# Patient Record
Sex: Female | Born: 1955 | Race: White | Hispanic: No | Smoking: Current every day smoker
Health system: Southern US, Community
[De-identification: ages and names within clinical notes are randomized; demographics above are authoritative.]

## PROBLEM LIST (undated history)

## (undated) DIAGNOSIS — I10 Essential (primary) hypertension: Secondary | ICD-10-CM

## (undated) DIAGNOSIS — E785 Hyperlipidemia, unspecified: Secondary | ICD-10-CM

## (undated) HISTORY — DX: Essential (primary) hypertension: I10

## (undated) HISTORY — PX: HYSTEROTOMY: SHX1776

## (undated) HISTORY — DX: Hyperlipidemia, unspecified: E78.5

## (undated) HISTORY — PX: CHOLECYSTECTOMY: SHX55

---

## 2019-04-18 DIAGNOSIS — M159 Polyosteoarthritis, unspecified: Secondary | ICD-10-CM | POA: Diagnosis not present

## 2019-04-18 DIAGNOSIS — Z1331 Encounter for screening for depression: Secondary | ICD-10-CM | POA: Diagnosis not present

## 2019-04-18 DIAGNOSIS — K219 Gastro-esophageal reflux disease without esophagitis: Secondary | ICD-10-CM | POA: Diagnosis not present

## 2019-04-18 DIAGNOSIS — R519 Headache, unspecified: Secondary | ICD-10-CM | POA: Diagnosis not present

## 2019-04-18 DIAGNOSIS — R252 Cramp and spasm: Secondary | ICD-10-CM | POA: Diagnosis not present

## 2019-04-18 DIAGNOSIS — R7989 Other specified abnormal findings of blood chemistry: Secondary | ICD-10-CM | POA: Diagnosis not present

## 2019-04-18 DIAGNOSIS — J208 Acute bronchitis due to other specified organisms: Secondary | ICD-10-CM | POA: Diagnosis not present

## 2019-04-18 DIAGNOSIS — E781 Pure hyperglyceridemia: Secondary | ICD-10-CM | POA: Diagnosis not present

## 2019-04-18 DIAGNOSIS — F419 Anxiety disorder, unspecified: Secondary | ICD-10-CM | POA: Diagnosis not present

## 2019-05-02 DIAGNOSIS — E663 Overweight: Secondary | ICD-10-CM | POA: Diagnosis not present

## 2019-05-02 DIAGNOSIS — Z720 Tobacco use: Secondary | ICD-10-CM | POA: Diagnosis not present

## 2019-05-02 DIAGNOSIS — E781 Pure hyperglyceridemia: Secondary | ICD-10-CM | POA: Diagnosis not present

## 2019-05-02 DIAGNOSIS — R7989 Other specified abnormal findings of blood chemistry: Secondary | ICD-10-CM | POA: Diagnosis not present

## 2019-05-02 DIAGNOSIS — M79604 Pain in right leg: Secondary | ICD-10-CM | POA: Diagnosis not present

## 2019-05-02 DIAGNOSIS — F419 Anxiety disorder, unspecified: Secondary | ICD-10-CM | POA: Diagnosis not present

## 2019-05-02 DIAGNOSIS — R252 Cramp and spasm: Secondary | ICD-10-CM | POA: Diagnosis not present

## 2019-05-02 DIAGNOSIS — R519 Headache, unspecified: Secondary | ICD-10-CM | POA: Diagnosis not present

## 2019-05-02 DIAGNOSIS — K219 Gastro-esophageal reflux disease without esophagitis: Secondary | ICD-10-CM | POA: Diagnosis not present

## 2019-05-19 DIAGNOSIS — M159 Polyosteoarthritis, unspecified: Secondary | ICD-10-CM | POA: Diagnosis not present

## 2019-05-19 DIAGNOSIS — E781 Pure hyperglyceridemia: Secondary | ICD-10-CM | POA: Diagnosis not present

## 2019-05-19 DIAGNOSIS — K219 Gastro-esophageal reflux disease without esophagitis: Secondary | ICD-10-CM | POA: Diagnosis not present

## 2019-05-19 DIAGNOSIS — F419 Anxiety disorder, unspecified: Secondary | ICD-10-CM | POA: Diagnosis not present

## 2019-05-19 DIAGNOSIS — M659 Synovitis and tenosynovitis, unspecified: Secondary | ICD-10-CM | POA: Diagnosis not present

## 2019-05-19 DIAGNOSIS — R252 Cramp and spasm: Secondary | ICD-10-CM | POA: Diagnosis not present

## 2019-05-19 DIAGNOSIS — R7989 Other specified abnormal findings of blood chemistry: Secondary | ICD-10-CM | POA: Diagnosis not present

## 2019-05-19 DIAGNOSIS — R519 Headache, unspecified: Secondary | ICD-10-CM | POA: Diagnosis not present

## 2019-05-19 DIAGNOSIS — M79604 Pain in right leg: Secondary | ICD-10-CM | POA: Diagnosis not present

## 2019-06-02 DIAGNOSIS — R252 Cramp and spasm: Secondary | ICD-10-CM | POA: Diagnosis not present

## 2019-06-02 DIAGNOSIS — M79604 Pain in right leg: Secondary | ICD-10-CM | POA: Diagnosis not present

## 2019-06-02 DIAGNOSIS — R519 Headache, unspecified: Secondary | ICD-10-CM | POA: Diagnosis not present

## 2019-06-02 DIAGNOSIS — F419 Anxiety disorder, unspecified: Secondary | ICD-10-CM | POA: Diagnosis not present

## 2019-06-02 DIAGNOSIS — K219 Gastro-esophageal reflux disease without esophagitis: Secondary | ICD-10-CM | POA: Diagnosis not present

## 2019-06-02 DIAGNOSIS — M159 Polyosteoarthritis, unspecified: Secondary | ICD-10-CM | POA: Diagnosis not present

## 2019-06-02 DIAGNOSIS — M79605 Pain in left leg: Secondary | ICD-10-CM | POA: Diagnosis not present

## 2019-06-02 DIAGNOSIS — E781 Pure hyperglyceridemia: Secondary | ICD-10-CM | POA: Diagnosis not present

## 2019-06-02 DIAGNOSIS — R7989 Other specified abnormal findings of blood chemistry: Secondary | ICD-10-CM | POA: Diagnosis not present

## 2019-06-13 DIAGNOSIS — H52223 Regular astigmatism, bilateral: Secondary | ICD-10-CM | POA: Diagnosis not present

## 2019-06-15 DIAGNOSIS — M159 Polyosteoarthritis, unspecified: Secondary | ICD-10-CM | POA: Diagnosis not present

## 2019-06-15 DIAGNOSIS — R519 Headache, unspecified: Secondary | ICD-10-CM | POA: Diagnosis not present

## 2019-06-15 DIAGNOSIS — R7989 Other specified abnormal findings of blood chemistry: Secondary | ICD-10-CM | POA: Diagnosis not present

## 2019-06-15 DIAGNOSIS — E663 Overweight: Secondary | ICD-10-CM | POA: Diagnosis not present

## 2019-06-15 DIAGNOSIS — R252 Cramp and spasm: Secondary | ICD-10-CM | POA: Diagnosis not present

## 2019-06-15 DIAGNOSIS — M79604 Pain in right leg: Secondary | ICD-10-CM | POA: Diagnosis not present

## 2019-06-15 DIAGNOSIS — E781 Pure hyperglyceridemia: Secondary | ICD-10-CM | POA: Diagnosis not present

## 2019-06-15 DIAGNOSIS — K219 Gastro-esophageal reflux disease without esophagitis: Secondary | ICD-10-CM | POA: Diagnosis not present

## 2019-06-15 DIAGNOSIS — F419 Anxiety disorder, unspecified: Secondary | ICD-10-CM | POA: Diagnosis not present

## 2019-06-16 DIAGNOSIS — H52223 Regular astigmatism, bilateral: Secondary | ICD-10-CM | POA: Diagnosis not present

## 2019-06-16 DIAGNOSIS — H524 Presbyopia: Secondary | ICD-10-CM | POA: Diagnosis not present

## 2019-06-23 DIAGNOSIS — K219 Gastro-esophageal reflux disease without esophagitis: Secondary | ICD-10-CM | POA: Diagnosis not present

## 2019-06-23 DIAGNOSIS — J208 Acute bronchitis due to other specified organisms: Secondary | ICD-10-CM | POA: Diagnosis not present

## 2019-06-23 DIAGNOSIS — E781 Pure hyperglyceridemia: Secondary | ICD-10-CM | POA: Diagnosis not present

## 2019-06-23 DIAGNOSIS — R252 Cramp and spasm: Secondary | ICD-10-CM | POA: Diagnosis not present

## 2019-06-23 DIAGNOSIS — F419 Anxiety disorder, unspecified: Secondary | ICD-10-CM | POA: Diagnosis not present

## 2019-06-23 DIAGNOSIS — R7989 Other specified abnormal findings of blood chemistry: Secondary | ICD-10-CM | POA: Diagnosis not present

## 2019-06-23 DIAGNOSIS — M159 Polyosteoarthritis, unspecified: Secondary | ICD-10-CM | POA: Diagnosis not present

## 2019-06-23 DIAGNOSIS — R519 Headache, unspecified: Secondary | ICD-10-CM | POA: Diagnosis not present

## 2019-06-23 DIAGNOSIS — B9689 Other specified bacterial agents as the cause of diseases classified elsewhere: Secondary | ICD-10-CM | POA: Diagnosis not present

## 2019-07-07 DIAGNOSIS — Z720 Tobacco use: Secondary | ICD-10-CM | POA: Diagnosis not present

## 2019-07-07 DIAGNOSIS — K219 Gastro-esophageal reflux disease without esophagitis: Secondary | ICD-10-CM | POA: Diagnosis not present

## 2019-07-07 DIAGNOSIS — F419 Anxiety disorder, unspecified: Secondary | ICD-10-CM | POA: Diagnosis not present

## 2019-07-07 DIAGNOSIS — E781 Pure hyperglyceridemia: Secondary | ICD-10-CM | POA: Diagnosis not present

## 2019-07-07 DIAGNOSIS — R519 Headache, unspecified: Secondary | ICD-10-CM | POA: Diagnosis not present

## 2019-07-07 DIAGNOSIS — M159 Polyosteoarthritis, unspecified: Secondary | ICD-10-CM | POA: Diagnosis not present

## 2019-07-07 DIAGNOSIS — R7989 Other specified abnormal findings of blood chemistry: Secondary | ICD-10-CM | POA: Diagnosis not present

## 2019-07-07 DIAGNOSIS — R252 Cramp and spasm: Secondary | ICD-10-CM | POA: Diagnosis not present

## 2019-07-07 DIAGNOSIS — M79604 Pain in right leg: Secondary | ICD-10-CM | POA: Diagnosis not present

## 2019-07-21 DIAGNOSIS — R7989 Other specified abnormal findings of blood chemistry: Secondary | ICD-10-CM | POA: Diagnosis not present

## 2019-07-21 DIAGNOSIS — M79604 Pain in right leg: Secondary | ICD-10-CM | POA: Diagnosis not present

## 2019-07-21 DIAGNOSIS — F419 Anxiety disorder, unspecified: Secondary | ICD-10-CM | POA: Diagnosis not present

## 2019-07-21 DIAGNOSIS — M79605 Pain in left leg: Secondary | ICD-10-CM | POA: Diagnosis not present

## 2019-07-21 DIAGNOSIS — M159 Polyosteoarthritis, unspecified: Secondary | ICD-10-CM | POA: Diagnosis not present

## 2019-07-21 DIAGNOSIS — K219 Gastro-esophageal reflux disease without esophagitis: Secondary | ICD-10-CM | POA: Diagnosis not present

## 2019-07-21 DIAGNOSIS — R252 Cramp and spasm: Secondary | ICD-10-CM | POA: Diagnosis not present

## 2019-07-21 DIAGNOSIS — R519 Headache, unspecified: Secondary | ICD-10-CM | POA: Diagnosis not present

## 2019-07-21 DIAGNOSIS — E781 Pure hyperglyceridemia: Secondary | ICD-10-CM | POA: Diagnosis not present

## 2019-08-04 DIAGNOSIS — E663 Overweight: Secondary | ICD-10-CM | POA: Diagnosis not present

## 2019-08-04 DIAGNOSIS — Z6824 Body mass index (BMI) 24.0-24.9, adult: Secondary | ICD-10-CM | POA: Diagnosis not present

## 2019-08-04 DIAGNOSIS — E781 Pure hyperglyceridemia: Secondary | ICD-10-CM | POA: Diagnosis not present

## 2019-08-04 DIAGNOSIS — F419 Anxiety disorder, unspecified: Secondary | ICD-10-CM | POA: Diagnosis not present

## 2019-08-04 DIAGNOSIS — R519 Headache, unspecified: Secondary | ICD-10-CM | POA: Diagnosis not present

## 2019-08-09 DIAGNOSIS — G8929 Other chronic pain: Secondary | ICD-10-CM | POA: Insufficient documentation

## 2019-08-09 DIAGNOSIS — M545 Low back pain: Secondary | ICD-10-CM | POA: Diagnosis not present

## 2019-08-09 DIAGNOSIS — M5136 Other intervertebral disc degeneration, lumbar region: Secondary | ICD-10-CM | POA: Insufficient documentation

## 2019-08-09 DIAGNOSIS — E7800 Pure hypercholesterolemia, unspecified: Secondary | ICD-10-CM

## 2019-08-09 HISTORY — DX: Other intervertebral disc degeneration, lumbar region: M51.36

## 2019-08-09 HISTORY — DX: Pure hypercholesterolemia, unspecified: E78.00

## 2019-08-12 ENCOUNTER — Other Ambulatory Visit: Payer: Self-pay | Admitting: Orthopedic Surgery

## 2019-08-12 DIAGNOSIS — M545 Low back pain, unspecified: Secondary | ICD-10-CM

## 2019-08-18 DIAGNOSIS — R519 Headache, unspecified: Secondary | ICD-10-CM | POA: Diagnosis not present

## 2019-08-18 DIAGNOSIS — R252 Cramp and spasm: Secondary | ICD-10-CM | POA: Diagnosis not present

## 2019-08-18 DIAGNOSIS — R7989 Other specified abnormal findings of blood chemistry: Secondary | ICD-10-CM | POA: Diagnosis not present

## 2019-08-18 DIAGNOSIS — F419 Anxiety disorder, unspecified: Secondary | ICD-10-CM | POA: Diagnosis not present

## 2019-08-18 DIAGNOSIS — K219 Gastro-esophageal reflux disease without esophagitis: Secondary | ICD-10-CM | POA: Diagnosis not present

## 2019-08-18 DIAGNOSIS — M159 Polyosteoarthritis, unspecified: Secondary | ICD-10-CM | POA: Diagnosis not present

## 2019-08-18 DIAGNOSIS — M25551 Pain in right hip: Secondary | ICD-10-CM | POA: Diagnosis not present

## 2019-08-18 DIAGNOSIS — Z720 Tobacco use: Secondary | ICD-10-CM | POA: Diagnosis not present

## 2019-08-18 DIAGNOSIS — E781 Pure hyperglyceridemia: Secondary | ICD-10-CM | POA: Diagnosis not present

## 2019-09-06 DIAGNOSIS — Z720 Tobacco use: Secondary | ICD-10-CM | POA: Diagnosis not present

## 2019-09-06 DIAGNOSIS — B9689 Other specified bacterial agents as the cause of diseases classified elsewhere: Secondary | ICD-10-CM | POA: Diagnosis not present

## 2019-09-06 DIAGNOSIS — R519 Headache, unspecified: Secondary | ICD-10-CM | POA: Diagnosis not present

## 2019-09-06 DIAGNOSIS — K219 Gastro-esophageal reflux disease without esophagitis: Secondary | ICD-10-CM | POA: Diagnosis not present

## 2019-09-06 DIAGNOSIS — R7989 Other specified abnormal findings of blood chemistry: Secondary | ICD-10-CM | POA: Diagnosis not present

## 2019-09-06 DIAGNOSIS — J028 Acute pharyngitis due to other specified organisms: Secondary | ICD-10-CM | POA: Diagnosis not present

## 2019-09-06 DIAGNOSIS — F419 Anxiety disorder, unspecified: Secondary | ICD-10-CM | POA: Diagnosis not present

## 2019-09-06 DIAGNOSIS — E781 Pure hyperglyceridemia: Secondary | ICD-10-CM | POA: Diagnosis not present

## 2019-09-06 DIAGNOSIS — R252 Cramp and spasm: Secondary | ICD-10-CM | POA: Diagnosis not present

## 2019-09-12 ENCOUNTER — Ambulatory Visit
Admission: RE | Admit: 2019-09-12 | Discharge: 2019-09-12 | Disposition: A | Discharge status: Home / Self Care | Payer: Medicare HMO | Source: Ambulatory Visit | Attending: Orthopedic Surgery | Admitting: Orthopedic Surgery

## 2019-09-12 ENCOUNTER — Other Ambulatory Visit: Payer: Self-pay

## 2019-09-12 DIAGNOSIS — M48061 Spinal stenosis, lumbar region without neurogenic claudication: Secondary | ICD-10-CM | POA: Diagnosis not present

## 2019-09-12 DIAGNOSIS — M545 Low back pain, unspecified: Secondary | ICD-10-CM

## 2019-09-15 DIAGNOSIS — R252 Cramp and spasm: Secondary | ICD-10-CM | POA: Diagnosis not present

## 2019-09-15 DIAGNOSIS — F419 Anxiety disorder, unspecified: Secondary | ICD-10-CM | POA: Diagnosis not present

## 2019-09-15 DIAGNOSIS — R0600 Dyspnea, unspecified: Secondary | ICD-10-CM | POA: Diagnosis not present

## 2019-09-15 DIAGNOSIS — K219 Gastro-esophageal reflux disease without esophagitis: Secondary | ICD-10-CM | POA: Diagnosis not present

## 2019-09-15 DIAGNOSIS — E781 Pure hyperglyceridemia: Secondary | ICD-10-CM | POA: Diagnosis not present

## 2019-09-15 DIAGNOSIS — M79604 Pain in right leg: Secondary | ICD-10-CM | POA: Diagnosis not present

## 2019-09-15 DIAGNOSIS — R7989 Other specified abnormal findings of blood chemistry: Secondary | ICD-10-CM | POA: Diagnosis not present

## 2019-09-15 DIAGNOSIS — M79605 Pain in left leg: Secondary | ICD-10-CM | POA: Diagnosis not present

## 2019-09-15 DIAGNOSIS — M159 Polyosteoarthritis, unspecified: Secondary | ICD-10-CM | POA: Diagnosis not present

## 2019-09-15 DIAGNOSIS — R519 Headache, unspecified: Secondary | ICD-10-CM | POA: Diagnosis not present

## 2019-09-27 DIAGNOSIS — M5136 Other intervertebral disc degeneration, lumbar region: Secondary | ICD-10-CM | POA: Diagnosis not present

## 2019-10-06 DIAGNOSIS — M545 Low back pain: Secondary | ICD-10-CM | POA: Diagnosis not present

## 2019-10-06 DIAGNOSIS — M5136 Other intervertebral disc degeneration, lumbar region: Secondary | ICD-10-CM | POA: Diagnosis not present

## 2019-10-13 DIAGNOSIS — R7989 Other specified abnormal findings of blood chemistry: Secondary | ICD-10-CM | POA: Diagnosis not present

## 2019-10-13 DIAGNOSIS — M79604 Pain in right leg: Secondary | ICD-10-CM | POA: Diagnosis not present

## 2019-10-13 DIAGNOSIS — F419 Anxiety disorder, unspecified: Secondary | ICD-10-CM | POA: Diagnosis not present

## 2019-10-13 DIAGNOSIS — R252 Cramp and spasm: Secondary | ICD-10-CM | POA: Diagnosis not present

## 2019-10-13 DIAGNOSIS — M79605 Pain in left leg: Secondary | ICD-10-CM | POA: Diagnosis not present

## 2019-10-13 DIAGNOSIS — K219 Gastro-esophageal reflux disease without esophagitis: Secondary | ICD-10-CM | POA: Diagnosis not present

## 2019-10-13 DIAGNOSIS — R519 Headache, unspecified: Secondary | ICD-10-CM | POA: Diagnosis not present

## 2019-10-13 DIAGNOSIS — E781 Pure hyperglyceridemia: Secondary | ICD-10-CM | POA: Diagnosis not present

## 2019-10-13 DIAGNOSIS — M5136 Other intervertebral disc degeneration, lumbar region: Secondary | ICD-10-CM | POA: Diagnosis not present

## 2019-10-13 DIAGNOSIS — M159 Polyosteoarthritis, unspecified: Secondary | ICD-10-CM | POA: Diagnosis not present

## 2019-10-18 DIAGNOSIS — M5136 Other intervertebral disc degeneration, lumbar region: Secondary | ICD-10-CM | POA: Diagnosis not present

## 2019-10-18 DIAGNOSIS — M545 Low back pain: Secondary | ICD-10-CM | POA: Diagnosis not present

## 2019-10-24 DIAGNOSIS — M5136 Other intervertebral disc degeneration, lumbar region: Secondary | ICD-10-CM | POA: Diagnosis not present

## 2019-10-24 DIAGNOSIS — M48062 Spinal stenosis, lumbar region with neurogenic claudication: Secondary | ICD-10-CM | POA: Diagnosis not present

## 2019-10-24 DIAGNOSIS — G8929 Other chronic pain: Secondary | ICD-10-CM | POA: Diagnosis not present

## 2019-11-02 DIAGNOSIS — J208 Acute bronchitis due to other specified organisms: Secondary | ICD-10-CM | POA: Diagnosis not present

## 2019-11-02 DIAGNOSIS — R7989 Other specified abnormal findings of blood chemistry: Secondary | ICD-10-CM | POA: Diagnosis not present

## 2019-11-02 DIAGNOSIS — E781 Pure hyperglyceridemia: Secondary | ICD-10-CM | POA: Diagnosis not present

## 2019-11-02 DIAGNOSIS — R252 Cramp and spasm: Secondary | ICD-10-CM | POA: Diagnosis not present

## 2019-11-02 DIAGNOSIS — Z720 Tobacco use: Secondary | ICD-10-CM | POA: Diagnosis not present

## 2019-11-02 DIAGNOSIS — R519 Headache, unspecified: Secondary | ICD-10-CM | POA: Diagnosis not present

## 2019-11-02 DIAGNOSIS — F419 Anxiety disorder, unspecified: Secondary | ICD-10-CM | POA: Diagnosis not present

## 2019-11-02 DIAGNOSIS — K219 Gastro-esophageal reflux disease without esophagitis: Secondary | ICD-10-CM | POA: Diagnosis not present

## 2019-11-02 DIAGNOSIS — B9689 Other specified bacterial agents as the cause of diseases classified elsewhere: Secondary | ICD-10-CM | POA: Diagnosis not present

## 2019-11-10 DIAGNOSIS — F419 Anxiety disorder, unspecified: Secondary | ICD-10-CM | POA: Diagnosis not present

## 2019-11-10 DIAGNOSIS — R252 Cramp and spasm: Secondary | ICD-10-CM | POA: Diagnosis not present

## 2019-11-10 DIAGNOSIS — R519 Headache, unspecified: Secondary | ICD-10-CM | POA: Diagnosis not present

## 2019-11-10 DIAGNOSIS — K219 Gastro-esophageal reflux disease without esophagitis: Secondary | ICD-10-CM | POA: Diagnosis not present

## 2019-11-10 DIAGNOSIS — R7989 Other specified abnormal findings of blood chemistry: Secondary | ICD-10-CM | POA: Diagnosis not present

## 2019-11-10 DIAGNOSIS — E781 Pure hyperglyceridemia: Secondary | ICD-10-CM | POA: Diagnosis not present

## 2019-11-10 DIAGNOSIS — J208 Acute bronchitis due to other specified organisms: Secondary | ICD-10-CM | POA: Diagnosis not present

## 2019-11-10 DIAGNOSIS — B9689 Other specified bacterial agents as the cause of diseases classified elsewhere: Secondary | ICD-10-CM | POA: Diagnosis not present

## 2019-11-10 DIAGNOSIS — Z720 Tobacco use: Secondary | ICD-10-CM | POA: Diagnosis not present

## 2019-11-16 DIAGNOSIS — E663 Overweight: Secondary | ICD-10-CM | POA: Diagnosis not present

## 2019-11-16 DIAGNOSIS — J309 Allergic rhinitis, unspecified: Secondary | ICD-10-CM | POA: Diagnosis not present

## 2019-11-16 DIAGNOSIS — Z6825 Body mass index (BMI) 25.0-25.9, adult: Secondary | ICD-10-CM | POA: Diagnosis not present

## 2019-11-16 DIAGNOSIS — R252 Cramp and spasm: Secondary | ICD-10-CM | POA: Diagnosis not present

## 2019-11-16 DIAGNOSIS — Z720 Tobacco use: Secondary | ICD-10-CM | POA: Diagnosis not present

## 2019-11-16 DIAGNOSIS — F419 Anxiety disorder, unspecified: Secondary | ICD-10-CM | POA: Diagnosis not present

## 2019-11-16 DIAGNOSIS — R7989 Other specified abnormal findings of blood chemistry: Secondary | ICD-10-CM | POA: Diagnosis not present

## 2019-11-16 DIAGNOSIS — K219 Gastro-esophageal reflux disease without esophagitis: Secondary | ICD-10-CM | POA: Diagnosis not present

## 2019-11-24 DIAGNOSIS — F419 Anxiety disorder, unspecified: Secondary | ICD-10-CM | POA: Diagnosis not present

## 2019-11-24 DIAGNOSIS — M79605 Pain in left leg: Secondary | ICD-10-CM | POA: Diagnosis not present

## 2019-11-24 DIAGNOSIS — M79604 Pain in right leg: Secondary | ICD-10-CM | POA: Diagnosis not present

## 2019-11-24 DIAGNOSIS — R7989 Other specified abnormal findings of blood chemistry: Secondary | ICD-10-CM | POA: Diagnosis not present

## 2019-11-24 DIAGNOSIS — E781 Pure hyperglyceridemia: Secondary | ICD-10-CM | POA: Diagnosis not present

## 2019-11-24 DIAGNOSIS — Z720 Tobacco use: Secondary | ICD-10-CM | POA: Diagnosis not present

## 2019-11-24 DIAGNOSIS — R252 Cramp and spasm: Secondary | ICD-10-CM | POA: Diagnosis not present

## 2019-11-24 DIAGNOSIS — K219 Gastro-esophageal reflux disease without esophagitis: Secondary | ICD-10-CM | POA: Diagnosis not present

## 2019-11-24 DIAGNOSIS — M159 Polyosteoarthritis, unspecified: Secondary | ICD-10-CM | POA: Diagnosis not present

## 2019-12-08 DIAGNOSIS — K219 Gastro-esophageal reflux disease without esophagitis: Secondary | ICD-10-CM | POA: Diagnosis not present

## 2019-12-08 DIAGNOSIS — M159 Polyosteoarthritis, unspecified: Secondary | ICD-10-CM | POA: Diagnosis not present

## 2019-12-08 DIAGNOSIS — E781 Pure hyperglyceridemia: Secondary | ICD-10-CM | POA: Diagnosis not present

## 2019-12-08 DIAGNOSIS — F419 Anxiety disorder, unspecified: Secondary | ICD-10-CM | POA: Diagnosis not present

## 2019-12-08 DIAGNOSIS — M79604 Pain in right leg: Secondary | ICD-10-CM | POA: Diagnosis not present

## 2019-12-08 DIAGNOSIS — R519 Headache, unspecified: Secondary | ICD-10-CM | POA: Diagnosis not present

## 2019-12-08 DIAGNOSIS — R252 Cramp and spasm: Secondary | ICD-10-CM | POA: Diagnosis not present

## 2019-12-08 DIAGNOSIS — R7989 Other specified abnormal findings of blood chemistry: Secondary | ICD-10-CM | POA: Diagnosis not present

## 2019-12-08 DIAGNOSIS — M79605 Pain in left leg: Secondary | ICD-10-CM | POA: Diagnosis not present

## 2020-01-05 DIAGNOSIS — K219 Gastro-esophageal reflux disease without esophagitis: Secondary | ICD-10-CM | POA: Diagnosis not present

## 2020-01-05 DIAGNOSIS — M159 Polyosteoarthritis, unspecified: Secondary | ICD-10-CM | POA: Diagnosis not present

## 2020-01-05 DIAGNOSIS — E781 Pure hyperglyceridemia: Secondary | ICD-10-CM | POA: Diagnosis not present

## 2020-01-05 DIAGNOSIS — Z720 Tobacco use: Secondary | ICD-10-CM | POA: Diagnosis not present

## 2020-01-05 DIAGNOSIS — R252 Cramp and spasm: Secondary | ICD-10-CM | POA: Diagnosis not present

## 2020-01-05 DIAGNOSIS — R519 Headache, unspecified: Secondary | ICD-10-CM | POA: Diagnosis not present

## 2020-01-05 DIAGNOSIS — F419 Anxiety disorder, unspecified: Secondary | ICD-10-CM | POA: Diagnosis not present

## 2020-01-05 DIAGNOSIS — R7989 Other specified abnormal findings of blood chemistry: Secondary | ICD-10-CM | POA: Diagnosis not present

## 2020-01-05 DIAGNOSIS — Z23 Encounter for immunization: Secondary | ICD-10-CM | POA: Diagnosis not present

## 2020-01-24 DIAGNOSIS — R7989 Other specified abnormal findings of blood chemistry: Secondary | ICD-10-CM | POA: Diagnosis not present

## 2020-01-24 DIAGNOSIS — R519 Headache, unspecified: Secondary | ICD-10-CM | POA: Diagnosis not present

## 2020-01-24 DIAGNOSIS — L0390 Cellulitis, unspecified: Secondary | ICD-10-CM | POA: Diagnosis not present

## 2020-01-24 DIAGNOSIS — R252 Cramp and spasm: Secondary | ICD-10-CM | POA: Diagnosis not present

## 2020-01-24 DIAGNOSIS — F419 Anxiety disorder, unspecified: Secondary | ICD-10-CM | POA: Diagnosis not present

## 2020-01-24 DIAGNOSIS — K219 Gastro-esophageal reflux disease without esophagitis: Secondary | ICD-10-CM | POA: Diagnosis not present

## 2020-01-24 DIAGNOSIS — Z720 Tobacco use: Secondary | ICD-10-CM | POA: Diagnosis not present

## 2020-01-24 DIAGNOSIS — E781 Pure hyperglyceridemia: Secondary | ICD-10-CM | POA: Diagnosis not present

## 2020-01-24 DIAGNOSIS — M159 Polyosteoarthritis, unspecified: Secondary | ICD-10-CM | POA: Diagnosis not present

## 2020-02-02 DIAGNOSIS — R7989 Other specified abnormal findings of blood chemistry: Secondary | ICD-10-CM | POA: Diagnosis not present

## 2020-02-02 DIAGNOSIS — R252 Cramp and spasm: Secondary | ICD-10-CM | POA: Diagnosis not present

## 2020-02-02 DIAGNOSIS — Z720 Tobacco use: Secondary | ICD-10-CM | POA: Diagnosis not present

## 2020-02-02 DIAGNOSIS — F419 Anxiety disorder, unspecified: Secondary | ICD-10-CM | POA: Diagnosis not present

## 2020-02-02 DIAGNOSIS — L0390 Cellulitis, unspecified: Secondary | ICD-10-CM | POA: Diagnosis not present

## 2020-02-02 DIAGNOSIS — M159 Polyosteoarthritis, unspecified: Secondary | ICD-10-CM | POA: Diagnosis not present

## 2020-02-02 DIAGNOSIS — R519 Headache, unspecified: Secondary | ICD-10-CM | POA: Diagnosis not present

## 2020-02-02 DIAGNOSIS — E781 Pure hyperglyceridemia: Secondary | ICD-10-CM | POA: Diagnosis not present

## 2020-02-02 DIAGNOSIS — K219 Gastro-esophageal reflux disease without esophagitis: Secondary | ICD-10-CM | POA: Diagnosis not present

## 2020-03-01 DIAGNOSIS — F419 Anxiety disorder, unspecified: Secondary | ICD-10-CM | POA: Diagnosis not present

## 2020-03-01 DIAGNOSIS — R7989 Other specified abnormal findings of blood chemistry: Secondary | ICD-10-CM | POA: Diagnosis not present

## 2020-03-01 DIAGNOSIS — R030 Elevated blood-pressure reading, without diagnosis of hypertension: Secondary | ICD-10-CM | POA: Diagnosis not present

## 2020-03-01 DIAGNOSIS — J0100 Acute maxillary sinusitis, unspecified: Secondary | ICD-10-CM | POA: Diagnosis not present

## 2020-03-01 DIAGNOSIS — K219 Gastro-esophageal reflux disease without esophagitis: Secondary | ICD-10-CM | POA: Diagnosis not present

## 2020-03-01 DIAGNOSIS — Z720 Tobacco use: Secondary | ICD-10-CM | POA: Diagnosis not present

## 2020-03-01 DIAGNOSIS — R252 Cramp and spasm: Secondary | ICD-10-CM | POA: Diagnosis not present

## 2020-03-01 DIAGNOSIS — E781 Pure hyperglyceridemia: Secondary | ICD-10-CM | POA: Diagnosis not present

## 2020-03-01 DIAGNOSIS — M159 Polyosteoarthritis, unspecified: Secondary | ICD-10-CM | POA: Diagnosis not present

## 2020-03-05 DIAGNOSIS — R252 Cramp and spasm: Secondary | ICD-10-CM | POA: Diagnosis not present

## 2020-03-05 DIAGNOSIS — R7989 Other specified abnormal findings of blood chemistry: Secondary | ICD-10-CM | POA: Diagnosis not present

## 2020-03-05 DIAGNOSIS — I10 Essential (primary) hypertension: Secondary | ICD-10-CM | POA: Diagnosis not present

## 2020-03-05 DIAGNOSIS — E781 Pure hyperglyceridemia: Secondary | ICD-10-CM | POA: Diagnosis not present

## 2020-03-05 DIAGNOSIS — M79604 Pain in right leg: Secondary | ICD-10-CM | POA: Diagnosis not present

## 2020-03-05 DIAGNOSIS — R519 Headache, unspecified: Secondary | ICD-10-CM | POA: Diagnosis not present

## 2020-03-05 DIAGNOSIS — M159 Polyosteoarthritis, unspecified: Secondary | ICD-10-CM | POA: Diagnosis not present

## 2020-03-05 DIAGNOSIS — K219 Gastro-esophageal reflux disease without esophagitis: Secondary | ICD-10-CM | POA: Diagnosis not present

## 2020-03-05 DIAGNOSIS — F419 Anxiety disorder, unspecified: Secondary | ICD-10-CM | POA: Diagnosis not present

## 2020-03-13 DIAGNOSIS — R519 Headache, unspecified: Secondary | ICD-10-CM | POA: Diagnosis not present

## 2020-03-13 DIAGNOSIS — K219 Gastro-esophageal reflux disease without esophagitis: Secondary | ICD-10-CM | POA: Diagnosis not present

## 2020-03-13 DIAGNOSIS — R7989 Other specified abnormal findings of blood chemistry: Secondary | ICD-10-CM | POA: Diagnosis not present

## 2020-03-13 DIAGNOSIS — I10 Essential (primary) hypertension: Secondary | ICD-10-CM | POA: Diagnosis not present

## 2020-03-13 DIAGNOSIS — R252 Cramp and spasm: Secondary | ICD-10-CM | POA: Diagnosis not present

## 2020-03-13 DIAGNOSIS — M79604 Pain in right leg: Secondary | ICD-10-CM | POA: Diagnosis not present

## 2020-03-13 DIAGNOSIS — M159 Polyosteoarthritis, unspecified: Secondary | ICD-10-CM | POA: Diagnosis not present

## 2020-03-13 DIAGNOSIS — E781 Pure hyperglyceridemia: Secondary | ICD-10-CM | POA: Diagnosis not present

## 2020-03-13 DIAGNOSIS — F419 Anxiety disorder, unspecified: Secondary | ICD-10-CM | POA: Diagnosis not present

## 2020-04-05 DIAGNOSIS — B9689 Other specified bacterial agents as the cause of diseases classified elsewhere: Secondary | ICD-10-CM | POA: Diagnosis not present

## 2020-04-05 DIAGNOSIS — F419 Anxiety disorder, unspecified: Secondary | ICD-10-CM | POA: Diagnosis not present

## 2020-04-05 DIAGNOSIS — J208 Acute bronchitis due to other specified organisms: Secondary | ICD-10-CM | POA: Diagnosis not present

## 2020-04-05 DIAGNOSIS — R252 Cramp and spasm: Secondary | ICD-10-CM | POA: Diagnosis not present

## 2020-04-05 DIAGNOSIS — K219 Gastro-esophageal reflux disease without esophagitis: Secondary | ICD-10-CM | POA: Diagnosis not present

## 2020-04-05 DIAGNOSIS — M159 Polyosteoarthritis, unspecified: Secondary | ICD-10-CM | POA: Diagnosis not present

## 2020-04-05 DIAGNOSIS — R7989 Other specified abnormal findings of blood chemistry: Secondary | ICD-10-CM | POA: Diagnosis not present

## 2020-04-05 DIAGNOSIS — I10 Essential (primary) hypertension: Secondary | ICD-10-CM | POA: Diagnosis not present

## 2020-04-05 DIAGNOSIS — E781 Pure hyperglyceridemia: Secondary | ICD-10-CM | POA: Diagnosis not present

## 2020-04-19 DIAGNOSIS — J069 Acute upper respiratory infection, unspecified: Secondary | ICD-10-CM | POA: Diagnosis not present

## 2020-04-19 DIAGNOSIS — E781 Pure hyperglyceridemia: Secondary | ICD-10-CM | POA: Diagnosis not present

## 2020-04-19 DIAGNOSIS — R252 Cramp and spasm: Secondary | ICD-10-CM | POA: Diagnosis not present

## 2020-04-19 DIAGNOSIS — K219 Gastro-esophageal reflux disease without esophagitis: Secondary | ICD-10-CM | POA: Diagnosis not present

## 2020-04-19 DIAGNOSIS — R519 Headache, unspecified: Secondary | ICD-10-CM | POA: Diagnosis not present

## 2020-04-19 DIAGNOSIS — I10 Essential (primary) hypertension: Secondary | ICD-10-CM | POA: Diagnosis not present

## 2020-04-19 DIAGNOSIS — F419 Anxiety disorder, unspecified: Secondary | ICD-10-CM | POA: Diagnosis not present

## 2020-04-19 DIAGNOSIS — M79604 Pain in right leg: Secondary | ICD-10-CM | POA: Diagnosis not present

## 2020-04-19 DIAGNOSIS — R7989 Other specified abnormal findings of blood chemistry: Secondary | ICD-10-CM | POA: Diagnosis not present

## 2020-04-27 DIAGNOSIS — Z1231 Encounter for screening mammogram for malignant neoplasm of breast: Secondary | ICD-10-CM | POA: Diagnosis not present

## 2020-05-03 DIAGNOSIS — I10 Essential (primary) hypertension: Secondary | ICD-10-CM | POA: Diagnosis not present

## 2020-05-03 DIAGNOSIS — R252 Cramp and spasm: Secondary | ICD-10-CM | POA: Diagnosis not present

## 2020-05-03 DIAGNOSIS — M159 Polyosteoarthritis, unspecified: Secondary | ICD-10-CM | POA: Diagnosis not present

## 2020-05-03 DIAGNOSIS — R7989 Other specified abnormal findings of blood chemistry: Secondary | ICD-10-CM | POA: Diagnosis not present

## 2020-05-03 DIAGNOSIS — M79604 Pain in right leg: Secondary | ICD-10-CM | POA: Diagnosis not present

## 2020-05-03 DIAGNOSIS — F419 Anxiety disorder, unspecified: Secondary | ICD-10-CM | POA: Diagnosis not present

## 2020-05-03 DIAGNOSIS — R519 Headache, unspecified: Secondary | ICD-10-CM | POA: Diagnosis not present

## 2020-05-03 DIAGNOSIS — E781 Pure hyperglyceridemia: Secondary | ICD-10-CM | POA: Diagnosis not present

## 2020-05-03 DIAGNOSIS — K219 Gastro-esophageal reflux disease without esophagitis: Secondary | ICD-10-CM | POA: Diagnosis not present

## 2020-05-31 DIAGNOSIS — I10 Essential (primary) hypertension: Secondary | ICD-10-CM | POA: Diagnosis not present

## 2020-05-31 DIAGNOSIS — F419 Anxiety disorder, unspecified: Secondary | ICD-10-CM | POA: Diagnosis not present

## 2020-05-31 DIAGNOSIS — Z1331 Encounter for screening for depression: Secondary | ICD-10-CM | POA: Diagnosis not present

## 2020-05-31 DIAGNOSIS — K219 Gastro-esophageal reflux disease without esophagitis: Secondary | ICD-10-CM | POA: Diagnosis not present

## 2020-05-31 DIAGNOSIS — M159 Polyosteoarthritis, unspecified: Secondary | ICD-10-CM | POA: Diagnosis not present

## 2020-05-31 DIAGNOSIS — Z139 Encounter for screening, unspecified: Secondary | ICD-10-CM | POA: Diagnosis not present

## 2020-05-31 DIAGNOSIS — E781 Pure hyperglyceridemia: Secondary | ICD-10-CM | POA: Diagnosis not present

## 2020-05-31 DIAGNOSIS — R7989 Other specified abnormal findings of blood chemistry: Secondary | ICD-10-CM | POA: Diagnosis not present

## 2020-05-31 DIAGNOSIS — R252 Cramp and spasm: Secondary | ICD-10-CM | POA: Diagnosis not present

## 2020-06-28 DIAGNOSIS — R7989 Other specified abnormal findings of blood chemistry: Secondary | ICD-10-CM | POA: Diagnosis not present

## 2020-06-28 DIAGNOSIS — I10 Essential (primary) hypertension: Secondary | ICD-10-CM | POA: Diagnosis not present

## 2020-06-28 DIAGNOSIS — R252 Cramp and spasm: Secondary | ICD-10-CM | POA: Diagnosis not present

## 2020-06-28 DIAGNOSIS — R519 Headache, unspecified: Secondary | ICD-10-CM | POA: Diagnosis not present

## 2020-06-28 DIAGNOSIS — K219 Gastro-esophageal reflux disease without esophagitis: Secondary | ICD-10-CM | POA: Diagnosis not present

## 2020-06-28 DIAGNOSIS — M79604 Pain in right leg: Secondary | ICD-10-CM | POA: Diagnosis not present

## 2020-06-28 DIAGNOSIS — J309 Allergic rhinitis, unspecified: Secondary | ICD-10-CM | POA: Diagnosis not present

## 2020-06-28 DIAGNOSIS — M159 Polyosteoarthritis, unspecified: Secondary | ICD-10-CM | POA: Diagnosis not present

## 2020-06-28 DIAGNOSIS — F419 Anxiety disorder, unspecified: Secondary | ICD-10-CM | POA: Diagnosis not present

## 2020-07-05 DIAGNOSIS — H5203 Hypermetropia, bilateral: Secondary | ICD-10-CM | POA: Diagnosis not present

## 2020-07-26 DIAGNOSIS — R7989 Other specified abnormal findings of blood chemistry: Secondary | ICD-10-CM | POA: Diagnosis not present

## 2020-07-26 DIAGNOSIS — I10 Essential (primary) hypertension: Secondary | ICD-10-CM | POA: Diagnosis not present

## 2020-07-26 DIAGNOSIS — F419 Anxiety disorder, unspecified: Secondary | ICD-10-CM | POA: Diagnosis not present

## 2020-07-26 DIAGNOSIS — R252 Cramp and spasm: Secondary | ICD-10-CM | POA: Diagnosis not present

## 2020-07-26 DIAGNOSIS — J309 Allergic rhinitis, unspecified: Secondary | ICD-10-CM | POA: Diagnosis not present

## 2020-07-26 DIAGNOSIS — Z9181 History of falling: Secondary | ICD-10-CM | POA: Diagnosis not present

## 2020-07-26 DIAGNOSIS — R519 Headache, unspecified: Secondary | ICD-10-CM | POA: Diagnosis not present

## 2020-07-26 DIAGNOSIS — K219 Gastro-esophageal reflux disease without esophagitis: Secondary | ICD-10-CM | POA: Diagnosis not present

## 2020-07-26 DIAGNOSIS — M159 Polyosteoarthritis, unspecified: Secondary | ICD-10-CM | POA: Diagnosis not present

## 2020-08-24 DIAGNOSIS — K219 Gastro-esophageal reflux disease without esophagitis: Secondary | ICD-10-CM | POA: Diagnosis not present

## 2020-08-24 DIAGNOSIS — I10 Essential (primary) hypertension: Secondary | ICD-10-CM | POA: Diagnosis not present

## 2020-08-24 DIAGNOSIS — J069 Acute upper respiratory infection, unspecified: Secondary | ICD-10-CM | POA: Diagnosis not present

## 2020-08-24 DIAGNOSIS — R7989 Other specified abnormal findings of blood chemistry: Secondary | ICD-10-CM | POA: Diagnosis not present

## 2020-08-24 DIAGNOSIS — F419 Anxiety disorder, unspecified: Secondary | ICD-10-CM | POA: Diagnosis not present

## 2020-08-24 DIAGNOSIS — R252 Cramp and spasm: Secondary | ICD-10-CM | POA: Diagnosis not present

## 2020-08-24 DIAGNOSIS — R519 Headache, unspecified: Secondary | ICD-10-CM | POA: Diagnosis not present

## 2020-08-24 DIAGNOSIS — M159 Polyosteoarthritis, unspecified: Secondary | ICD-10-CM | POA: Diagnosis not present

## 2020-08-24 DIAGNOSIS — J309 Allergic rhinitis, unspecified: Secondary | ICD-10-CM | POA: Diagnosis not present

## 2020-09-26 DIAGNOSIS — F419 Anxiety disorder, unspecified: Secondary | ICD-10-CM | POA: Diagnosis not present

## 2020-09-26 DIAGNOSIS — R252 Cramp and spasm: Secondary | ICD-10-CM | POA: Diagnosis not present

## 2020-09-26 DIAGNOSIS — M79604 Pain in right leg: Secondary | ICD-10-CM | POA: Diagnosis not present

## 2020-09-26 DIAGNOSIS — K219 Gastro-esophageal reflux disease without esophagitis: Secondary | ICD-10-CM | POA: Diagnosis not present

## 2020-09-26 DIAGNOSIS — J309 Allergic rhinitis, unspecified: Secondary | ICD-10-CM | POA: Diagnosis not present

## 2020-09-26 DIAGNOSIS — I10 Essential (primary) hypertension: Secondary | ICD-10-CM | POA: Diagnosis not present

## 2020-09-26 DIAGNOSIS — R519 Headache, unspecified: Secondary | ICD-10-CM | POA: Diagnosis not present

## 2020-09-26 DIAGNOSIS — R7989 Other specified abnormal findings of blood chemistry: Secondary | ICD-10-CM | POA: Diagnosis not present

## 2020-09-26 DIAGNOSIS — M159 Polyosteoarthritis, unspecified: Secondary | ICD-10-CM | POA: Diagnosis not present

## 2020-10-24 DIAGNOSIS — R519 Headache, unspecified: Secondary | ICD-10-CM | POA: Diagnosis not present

## 2020-10-24 DIAGNOSIS — R252 Cramp and spasm: Secondary | ICD-10-CM | POA: Diagnosis not present

## 2020-10-24 DIAGNOSIS — R7989 Other specified abnormal findings of blood chemistry: Secondary | ICD-10-CM | POA: Diagnosis not present

## 2020-10-24 DIAGNOSIS — Z6825 Body mass index (BMI) 25.0-25.9, adult: Secondary | ICD-10-CM | POA: Diagnosis not present

## 2020-10-24 DIAGNOSIS — J309 Allergic rhinitis, unspecified: Secondary | ICD-10-CM | POA: Diagnosis not present

## 2020-10-24 DIAGNOSIS — F419 Anxiety disorder, unspecified: Secondary | ICD-10-CM | POA: Diagnosis not present

## 2020-10-24 DIAGNOSIS — K219 Gastro-esophageal reflux disease without esophagitis: Secondary | ICD-10-CM | POA: Diagnosis not present

## 2020-10-24 DIAGNOSIS — M159 Polyosteoarthritis, unspecified: Secondary | ICD-10-CM | POA: Diagnosis not present

## 2020-10-24 DIAGNOSIS — I10 Essential (primary) hypertension: Secondary | ICD-10-CM | POA: Diagnosis not present

## 2020-10-24 DIAGNOSIS — E781 Pure hyperglyceridemia: Secondary | ICD-10-CM | POA: Diagnosis not present

## 2020-10-30 DIAGNOSIS — Z0000 Encounter for general adult medical examination without abnormal findings: Secondary | ICD-10-CM | POA: Diagnosis not present

## 2020-10-30 DIAGNOSIS — Z1331 Encounter for screening for depression: Secondary | ICD-10-CM | POA: Diagnosis not present

## 2020-10-30 DIAGNOSIS — E785 Hyperlipidemia, unspecified: Secondary | ICD-10-CM | POA: Diagnosis not present

## 2020-10-30 DIAGNOSIS — Z9181 History of falling: Secondary | ICD-10-CM | POA: Diagnosis not present

## 2020-11-21 DIAGNOSIS — I10 Essential (primary) hypertension: Secondary | ICD-10-CM | POA: Diagnosis not present

## 2020-11-21 DIAGNOSIS — R519 Headache, unspecified: Secondary | ICD-10-CM | POA: Diagnosis not present

## 2020-11-21 DIAGNOSIS — B9689 Other specified bacterial agents as the cause of diseases classified elsewhere: Secondary | ICD-10-CM | POA: Diagnosis not present

## 2020-11-21 DIAGNOSIS — K219 Gastro-esophageal reflux disease without esophagitis: Secondary | ICD-10-CM | POA: Diagnosis not present

## 2020-11-21 DIAGNOSIS — F419 Anxiety disorder, unspecified: Secondary | ICD-10-CM | POA: Diagnosis not present

## 2020-11-21 DIAGNOSIS — M159 Polyosteoarthritis, unspecified: Secondary | ICD-10-CM | POA: Diagnosis not present

## 2020-11-21 DIAGNOSIS — R7989 Other specified abnormal findings of blood chemistry: Secondary | ICD-10-CM | POA: Diagnosis not present

## 2020-11-21 DIAGNOSIS — J208 Acute bronchitis due to other specified organisms: Secondary | ICD-10-CM | POA: Diagnosis not present

## 2020-11-21 DIAGNOSIS — R252 Cramp and spasm: Secondary | ICD-10-CM | POA: Diagnosis not present

## 2020-12-17 DIAGNOSIS — I10 Essential (primary) hypertension: Secondary | ICD-10-CM | POA: Diagnosis not present

## 2020-12-17 DIAGNOSIS — J208 Acute bronchitis due to other specified organisms: Secondary | ICD-10-CM | POA: Diagnosis not present

## 2020-12-17 DIAGNOSIS — Z6825 Body mass index (BMI) 25.0-25.9, adult: Secondary | ICD-10-CM | POA: Diagnosis not present

## 2020-12-17 DIAGNOSIS — F419 Anxiety disorder, unspecified: Secondary | ICD-10-CM | POA: Diagnosis not present

## 2020-12-19 DIAGNOSIS — R7989 Other specified abnormal findings of blood chemistry: Secondary | ICD-10-CM | POA: Diagnosis not present

## 2020-12-19 DIAGNOSIS — I10 Essential (primary) hypertension: Secondary | ICD-10-CM | POA: Diagnosis not present

## 2020-12-19 DIAGNOSIS — F419 Anxiety disorder, unspecified: Secondary | ICD-10-CM | POA: Diagnosis not present

## 2020-12-19 DIAGNOSIS — J309 Allergic rhinitis, unspecified: Secondary | ICD-10-CM | POA: Diagnosis not present

## 2020-12-19 DIAGNOSIS — J069 Acute upper respiratory infection, unspecified: Secondary | ICD-10-CM | POA: Diagnosis not present

## 2020-12-19 DIAGNOSIS — M79604 Pain in right leg: Secondary | ICD-10-CM | POA: Diagnosis not present

## 2020-12-19 DIAGNOSIS — R519 Headache, unspecified: Secondary | ICD-10-CM | POA: Diagnosis not present

## 2020-12-19 DIAGNOSIS — K219 Gastro-esophageal reflux disease without esophagitis: Secondary | ICD-10-CM | POA: Diagnosis not present

## 2020-12-19 DIAGNOSIS — R252 Cramp and spasm: Secondary | ICD-10-CM | POA: Diagnosis not present

## 2020-12-26 DIAGNOSIS — R7989 Other specified abnormal findings of blood chemistry: Secondary | ICD-10-CM | POA: Diagnosis not present

## 2020-12-26 DIAGNOSIS — M79604 Pain in right leg: Secondary | ICD-10-CM | POA: Diagnosis not present

## 2020-12-26 DIAGNOSIS — R519 Headache, unspecified: Secondary | ICD-10-CM | POA: Diagnosis not present

## 2020-12-26 DIAGNOSIS — F419 Anxiety disorder, unspecified: Secondary | ICD-10-CM | POA: Diagnosis not present

## 2020-12-26 DIAGNOSIS — I10 Essential (primary) hypertension: Secondary | ICD-10-CM | POA: Diagnosis not present

## 2020-12-26 DIAGNOSIS — M659 Synovitis and tenosynovitis, unspecified: Secondary | ICD-10-CM | POA: Diagnosis not present

## 2020-12-26 DIAGNOSIS — J309 Allergic rhinitis, unspecified: Secondary | ICD-10-CM | POA: Diagnosis not present

## 2020-12-26 DIAGNOSIS — R252 Cramp and spasm: Secondary | ICD-10-CM | POA: Diagnosis not present

## 2020-12-26 DIAGNOSIS — K219 Gastro-esophageal reflux disease without esophagitis: Secondary | ICD-10-CM | POA: Diagnosis not present

## 2021-01-02 DIAGNOSIS — M659 Synovitis and tenosynovitis, unspecified: Secondary | ICD-10-CM | POA: Diagnosis not present

## 2021-01-02 DIAGNOSIS — M79604 Pain in right leg: Secondary | ICD-10-CM | POA: Diagnosis not present

## 2021-01-02 DIAGNOSIS — R519 Headache, unspecified: Secondary | ICD-10-CM | POA: Diagnosis not present

## 2021-01-02 DIAGNOSIS — F419 Anxiety disorder, unspecified: Secondary | ICD-10-CM | POA: Diagnosis not present

## 2021-01-02 DIAGNOSIS — K219 Gastro-esophageal reflux disease without esophagitis: Secondary | ICD-10-CM | POA: Diagnosis not present

## 2021-01-02 DIAGNOSIS — R252 Cramp and spasm: Secondary | ICD-10-CM | POA: Diagnosis not present

## 2021-01-02 DIAGNOSIS — I10 Essential (primary) hypertension: Secondary | ICD-10-CM | POA: Diagnosis not present

## 2021-01-02 DIAGNOSIS — R7989 Other specified abnormal findings of blood chemistry: Secondary | ICD-10-CM | POA: Diagnosis not present

## 2021-01-02 DIAGNOSIS — J309 Allergic rhinitis, unspecified: Secondary | ICD-10-CM | POA: Diagnosis not present

## 2021-01-16 DIAGNOSIS — B9689 Other specified bacterial agents as the cause of diseases classified elsewhere: Secondary | ICD-10-CM | POA: Diagnosis not present

## 2021-01-16 DIAGNOSIS — F419 Anxiety disorder, unspecified: Secondary | ICD-10-CM | POA: Diagnosis not present

## 2021-01-16 DIAGNOSIS — R7989 Other specified abnormal findings of blood chemistry: Secondary | ICD-10-CM | POA: Diagnosis not present

## 2021-01-16 DIAGNOSIS — R519 Headache, unspecified: Secondary | ICD-10-CM | POA: Diagnosis not present

## 2021-01-16 DIAGNOSIS — M159 Polyosteoarthritis, unspecified: Secondary | ICD-10-CM | POA: Diagnosis not present

## 2021-01-16 DIAGNOSIS — E781 Pure hyperglyceridemia: Secondary | ICD-10-CM | POA: Diagnosis not present

## 2021-01-16 DIAGNOSIS — J208 Acute bronchitis due to other specified organisms: Secondary | ICD-10-CM | POA: Diagnosis not present

## 2021-01-16 DIAGNOSIS — R252 Cramp and spasm: Secondary | ICD-10-CM | POA: Diagnosis not present

## 2021-01-16 DIAGNOSIS — I10 Essential (primary) hypertension: Secondary | ICD-10-CM | POA: Diagnosis not present

## 2021-02-13 DIAGNOSIS — E663 Overweight: Secondary | ICD-10-CM | POA: Diagnosis not present

## 2021-02-13 DIAGNOSIS — J111 Influenza due to unidentified influenza virus with other respiratory manifestations: Secondary | ICD-10-CM | POA: Diagnosis not present

## 2021-02-13 DIAGNOSIS — F419 Anxiety disorder, unspecified: Secondary | ICD-10-CM | POA: Diagnosis not present

## 2021-02-13 DIAGNOSIS — Z6825 Body mass index (BMI) 25.0-25.9, adult: Secondary | ICD-10-CM | POA: Diagnosis not present

## 2021-02-13 DIAGNOSIS — I10 Essential (primary) hypertension: Secondary | ICD-10-CM | POA: Diagnosis not present

## 2021-02-19 DIAGNOSIS — I10 Essential (primary) hypertension: Secondary | ICD-10-CM | POA: Diagnosis not present

## 2021-02-19 DIAGNOSIS — Z6825 Body mass index (BMI) 25.0-25.9, adult: Secondary | ICD-10-CM | POA: Diagnosis not present

## 2021-02-19 DIAGNOSIS — E663 Overweight: Secondary | ICD-10-CM | POA: Diagnosis not present

## 2021-02-19 DIAGNOSIS — F419 Anxiety disorder, unspecified: Secondary | ICD-10-CM | POA: Diagnosis not present

## 2021-02-19 DIAGNOSIS — J069 Acute upper respiratory infection, unspecified: Secondary | ICD-10-CM | POA: Diagnosis not present

## 2021-03-12 DIAGNOSIS — I10 Essential (primary) hypertension: Secondary | ICD-10-CM | POA: Diagnosis not present

## 2021-03-12 DIAGNOSIS — R7989 Other specified abnormal findings of blood chemistry: Secondary | ICD-10-CM | POA: Diagnosis not present

## 2021-03-12 DIAGNOSIS — K219 Gastro-esophageal reflux disease without esophagitis: Secondary | ICD-10-CM | POA: Diagnosis not present

## 2021-03-12 DIAGNOSIS — M159 Polyosteoarthritis, unspecified: Secondary | ICD-10-CM | POA: Diagnosis not present

## 2021-03-12 DIAGNOSIS — R519 Headache, unspecified: Secondary | ICD-10-CM | POA: Diagnosis not present

## 2021-03-12 DIAGNOSIS — J309 Allergic rhinitis, unspecified: Secondary | ICD-10-CM | POA: Diagnosis not present

## 2021-03-12 DIAGNOSIS — M79604 Pain in right leg: Secondary | ICD-10-CM | POA: Diagnosis not present

## 2021-03-12 DIAGNOSIS — F419 Anxiety disorder, unspecified: Secondary | ICD-10-CM | POA: Diagnosis not present

## 2021-03-12 DIAGNOSIS — R252 Cramp and spasm: Secondary | ICD-10-CM | POA: Diagnosis not present

## 2021-04-09 DIAGNOSIS — K219 Gastro-esophageal reflux disease without esophagitis: Secondary | ICD-10-CM | POA: Diagnosis not present

## 2021-04-09 DIAGNOSIS — J309 Allergic rhinitis, unspecified: Secondary | ICD-10-CM | POA: Diagnosis not present

## 2021-04-09 DIAGNOSIS — E781 Pure hyperglyceridemia: Secondary | ICD-10-CM | POA: Diagnosis not present

## 2021-04-09 DIAGNOSIS — R252 Cramp and spasm: Secondary | ICD-10-CM | POA: Diagnosis not present

## 2021-04-09 DIAGNOSIS — I10 Essential (primary) hypertension: Secondary | ICD-10-CM | POA: Diagnosis not present

## 2021-04-09 DIAGNOSIS — R519 Headache, unspecified: Secondary | ICD-10-CM | POA: Diagnosis not present

## 2021-04-09 DIAGNOSIS — F419 Anxiety disorder, unspecified: Secondary | ICD-10-CM | POA: Diagnosis not present

## 2021-04-09 DIAGNOSIS — M159 Polyosteoarthritis, unspecified: Secondary | ICD-10-CM | POA: Diagnosis not present

## 2021-04-09 DIAGNOSIS — R7989 Other specified abnormal findings of blood chemistry: Secondary | ICD-10-CM | POA: Diagnosis not present

## 2021-04-09 DIAGNOSIS — M79604 Pain in right leg: Secondary | ICD-10-CM | POA: Diagnosis not present

## 2021-04-15 DIAGNOSIS — R7989 Other specified abnormal findings of blood chemistry: Secondary | ICD-10-CM | POA: Diagnosis not present

## 2021-04-15 DIAGNOSIS — R519 Headache, unspecified: Secondary | ICD-10-CM | POA: Diagnosis not present

## 2021-04-15 DIAGNOSIS — F419 Anxiety disorder, unspecified: Secondary | ICD-10-CM | POA: Diagnosis not present

## 2021-04-15 DIAGNOSIS — I10 Essential (primary) hypertension: Secondary | ICD-10-CM | POA: Diagnosis not present

## 2021-04-15 DIAGNOSIS — B9689 Other specified bacterial agents as the cause of diseases classified elsewhere: Secondary | ICD-10-CM | POA: Diagnosis not present

## 2021-04-15 DIAGNOSIS — K219 Gastro-esophageal reflux disease without esophagitis: Secondary | ICD-10-CM | POA: Diagnosis not present

## 2021-04-15 DIAGNOSIS — J208 Acute bronchitis due to other specified organisms: Secondary | ICD-10-CM | POA: Diagnosis not present

## 2021-04-15 DIAGNOSIS — M159 Polyosteoarthritis, unspecified: Secondary | ICD-10-CM | POA: Diagnosis not present

## 2021-04-15 DIAGNOSIS — R252 Cramp and spasm: Secondary | ICD-10-CM | POA: Diagnosis not present

## 2021-05-10 DIAGNOSIS — R7989 Other specified abnormal findings of blood chemistry: Secondary | ICD-10-CM | POA: Diagnosis not present

## 2021-05-10 DIAGNOSIS — M79605 Pain in left leg: Secondary | ICD-10-CM | POA: Diagnosis not present

## 2021-05-10 DIAGNOSIS — I10 Essential (primary) hypertension: Secondary | ICD-10-CM | POA: Diagnosis not present

## 2021-05-10 DIAGNOSIS — M79604 Pain in right leg: Secondary | ICD-10-CM | POA: Diagnosis not present

## 2021-05-10 DIAGNOSIS — R519 Headache, unspecified: Secondary | ICD-10-CM | POA: Diagnosis not present

## 2021-05-10 DIAGNOSIS — F419 Anxiety disorder, unspecified: Secondary | ICD-10-CM | POA: Diagnosis not present

## 2021-05-10 DIAGNOSIS — J309 Allergic rhinitis, unspecified: Secondary | ICD-10-CM | POA: Diagnosis not present

## 2021-05-10 DIAGNOSIS — M159 Polyosteoarthritis, unspecified: Secondary | ICD-10-CM | POA: Diagnosis not present

## 2021-05-10 DIAGNOSIS — K219 Gastro-esophageal reflux disease without esophagitis: Secondary | ICD-10-CM | POA: Diagnosis not present

## 2021-05-24 DIAGNOSIS — K635 Polyp of colon: Secondary | ICD-10-CM | POA: Diagnosis not present

## 2021-05-24 DIAGNOSIS — D120 Benign neoplasm of cecum: Secondary | ICD-10-CM | POA: Diagnosis not present

## 2021-05-24 DIAGNOSIS — Z86010 Personal history of colonic polyps: Secondary | ICD-10-CM | POA: Diagnosis not present

## 2021-05-24 DIAGNOSIS — Z1211 Encounter for screening for malignant neoplasm of colon: Secondary | ICD-10-CM | POA: Diagnosis not present

## 2021-05-27 DIAGNOSIS — R7989 Other specified abnormal findings of blood chemistry: Secondary | ICD-10-CM | POA: Diagnosis not present

## 2021-05-27 DIAGNOSIS — K219 Gastro-esophageal reflux disease without esophagitis: Secondary | ICD-10-CM | POA: Diagnosis not present

## 2021-05-27 DIAGNOSIS — M79605 Pain in left leg: Secondary | ICD-10-CM | POA: Diagnosis not present

## 2021-05-27 DIAGNOSIS — I10 Essential (primary) hypertension: Secondary | ICD-10-CM | POA: Diagnosis not present

## 2021-05-27 DIAGNOSIS — J309 Allergic rhinitis, unspecified: Secondary | ICD-10-CM | POA: Diagnosis not present

## 2021-05-27 DIAGNOSIS — M159 Polyosteoarthritis, unspecified: Secondary | ICD-10-CM | POA: Diagnosis not present

## 2021-05-27 DIAGNOSIS — F419 Anxiety disorder, unspecified: Secondary | ICD-10-CM | POA: Diagnosis not present

## 2021-05-27 DIAGNOSIS — M79604 Pain in right leg: Secondary | ICD-10-CM | POA: Diagnosis not present

## 2021-05-27 DIAGNOSIS — R519 Headache, unspecified: Secondary | ICD-10-CM | POA: Diagnosis not present

## 2021-06-10 DIAGNOSIS — I10 Essential (primary) hypertension: Secondary | ICD-10-CM | POA: Diagnosis not present

## 2021-06-10 DIAGNOSIS — R519 Headache, unspecified: Secondary | ICD-10-CM | POA: Diagnosis not present

## 2021-06-10 DIAGNOSIS — Z139 Encounter for screening, unspecified: Secondary | ICD-10-CM | POA: Diagnosis not present

## 2021-06-10 DIAGNOSIS — R7989 Other specified abnormal findings of blood chemistry: Secondary | ICD-10-CM | POA: Diagnosis not present

## 2021-06-10 DIAGNOSIS — M159 Polyosteoarthritis, unspecified: Secondary | ICD-10-CM | POA: Diagnosis not present

## 2021-06-10 DIAGNOSIS — M79604 Pain in right leg: Secondary | ICD-10-CM | POA: Diagnosis not present

## 2021-06-10 DIAGNOSIS — J309 Allergic rhinitis, unspecified: Secondary | ICD-10-CM | POA: Diagnosis not present

## 2021-06-10 DIAGNOSIS — F419 Anxiety disorder, unspecified: Secondary | ICD-10-CM | POA: Diagnosis not present

## 2021-06-10 DIAGNOSIS — K219 Gastro-esophageal reflux disease without esophagitis: Secondary | ICD-10-CM | POA: Diagnosis not present

## 2021-06-20 IMAGING — MR MR LUMBAR SPINE W/O CM
5 series · 40 of 48 positions shown · non-contrast
Comparison: No pertinent prior studies available for comparison.

CLINICAL DATA: Low back pain, unspecified back pain laterality,
unspecified chronicity, unspecified whether sciatica present.
Additional history provided by technologist: Patient reports
shooting pains in leg/back, weakness since 2343.

EXAM:
MRI LUMBAR SPINE WITHOUT CONTRAST
TECHNIQUE: Multiplanar, multisequence MR imaging of the lumbar spine was
performed. No intravenous contrast was administered.

[Series 3: T2 post-contrast · sagittal · 4.0mm · 0.88mm/px · 5 of 12 slices shown]
[im 1/12]
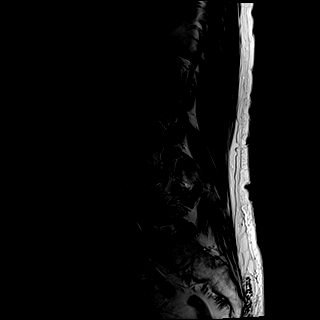
[im 3/12]
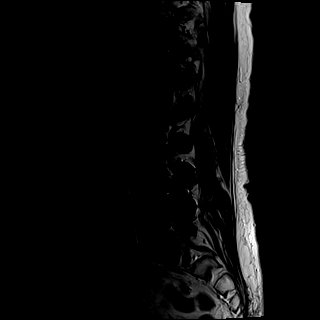
[im 6/12]
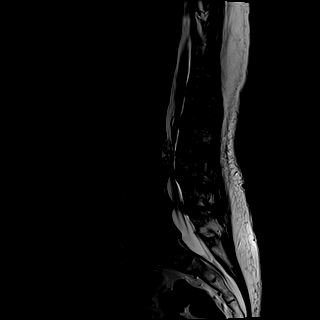
[im 9/12]
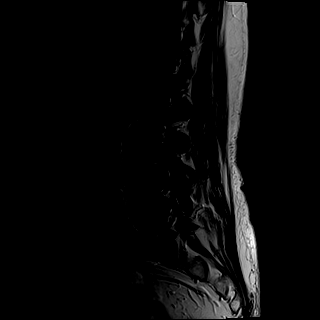
[im 12/12]
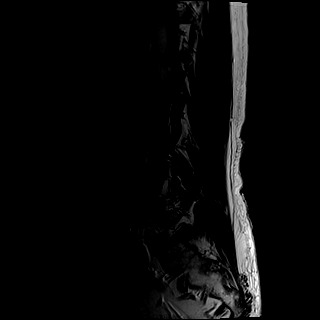

[Series 4: T1 · sagittal · 4.0mm · 0.88mm/px · 5 of 12 slices shown (1 of 2)]
[im 1/12]
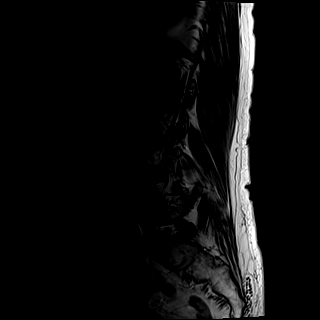
[im 3/12]
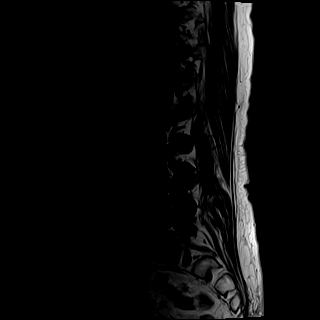
[im 6/12]
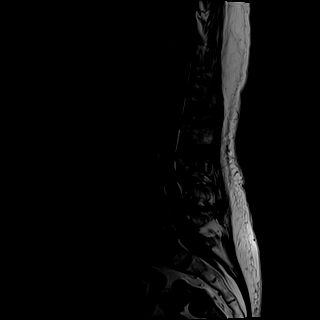
[im 9/12]
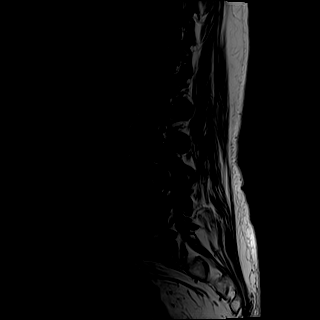
[im 12/12]
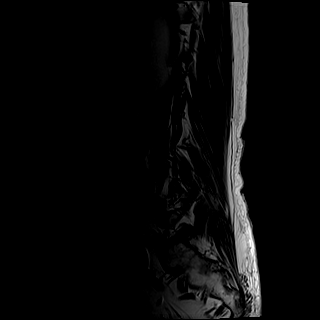

[Series 5: tirm sag · sagittal · 4.0mm · 0.55mm/px · 6 of 12 slices shown]
[im 1/12]
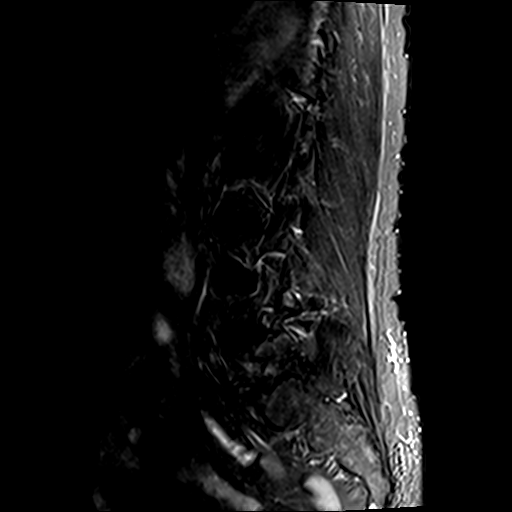
[im 3/12]
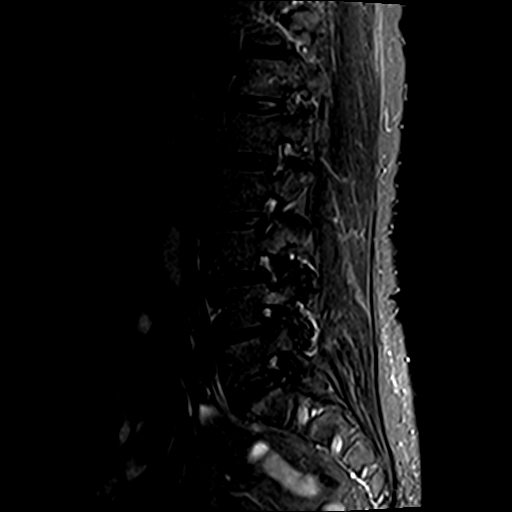
[im 5/12]
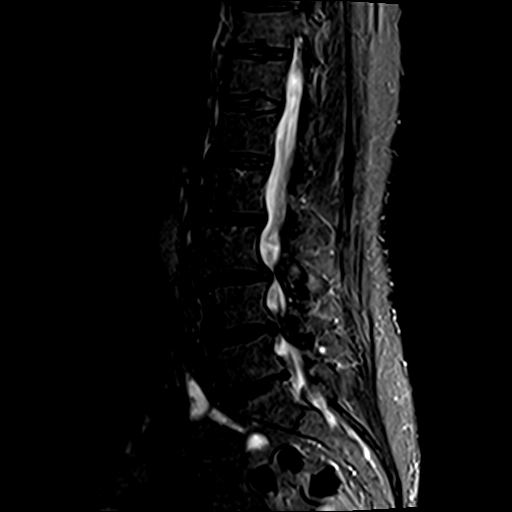
[im 7/12]
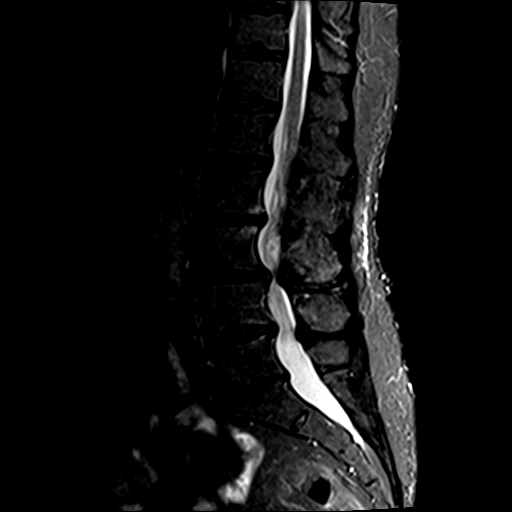
[im 9/12]
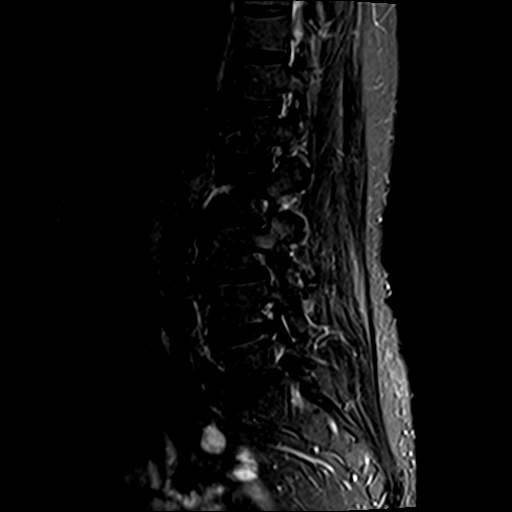
[im 12/12]
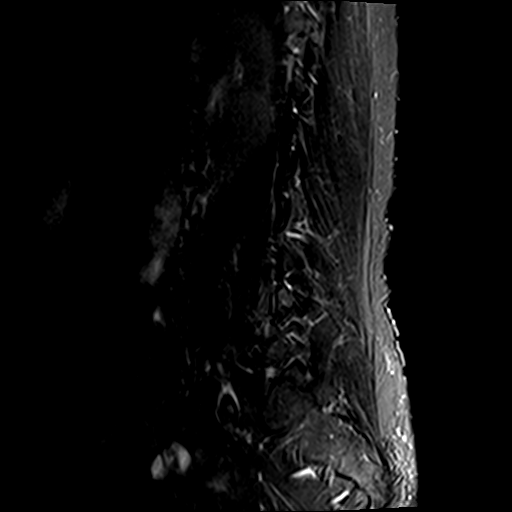

[Series 6: T1 · axial · 4.0mm · 0.78mm/px · z∈[-44,+145]mm · 10 of 34 slices shown (2 of 2)]
[im 3/34]
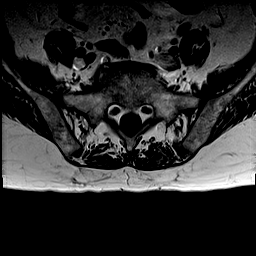
[im 5/34]
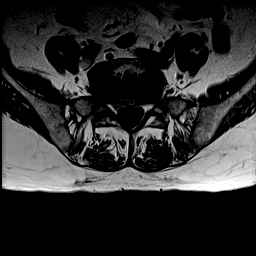
[im 7/34]
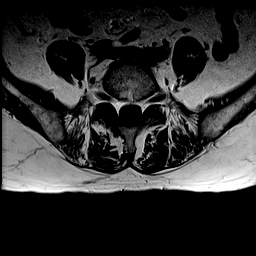
[im 12/34]
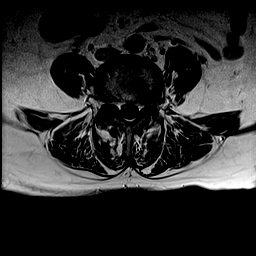
[im 16/34]
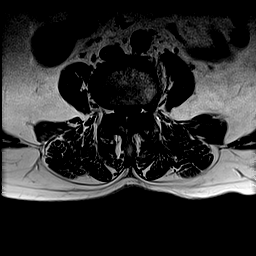
[im 18/34]
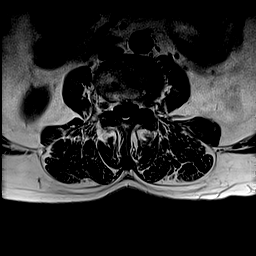
[im 20/34]
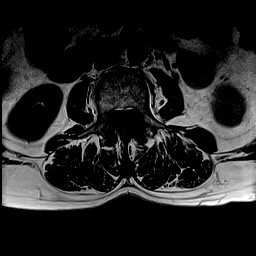
[im 25/34]
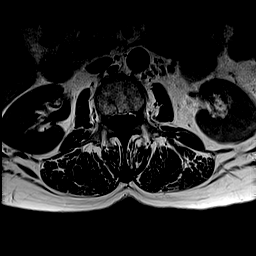
[im 29/34]
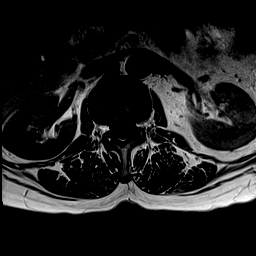
[im 34/34]
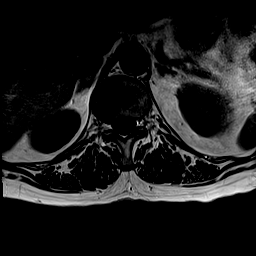

[Series 7: T2 · axial · 4.0mm · 0.78mm/px · z∈[-54,+145]mm · 14 of 34 slices shown]
[im 1/34]
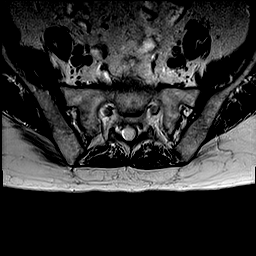
[im 3/34]
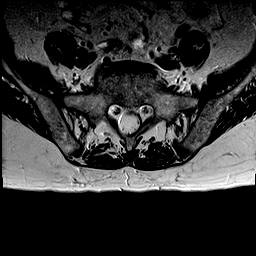
[im 5/34]
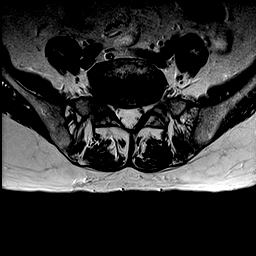
[im 7/34]
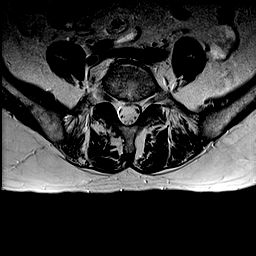
[im 9/34]
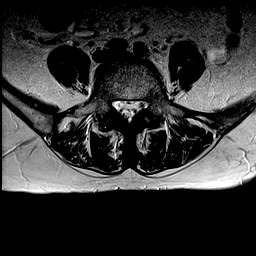
[im 12/34]
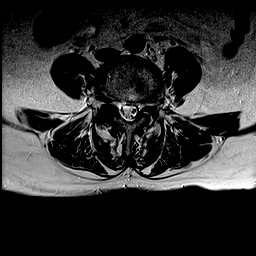
[im 14/34]
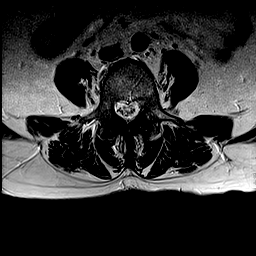
[im 16/34]
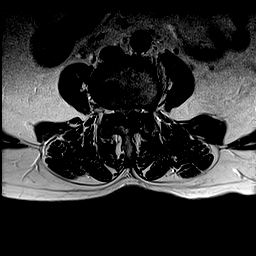
[im 18/34]
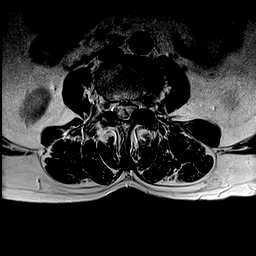
[im 20/34]
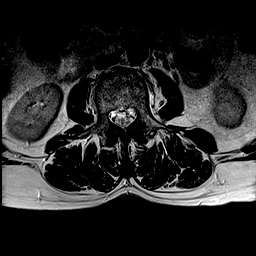
[im 23/34]
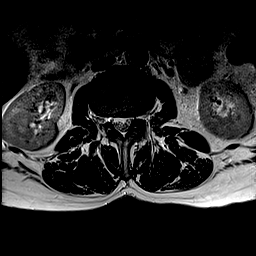
[im 25/34]
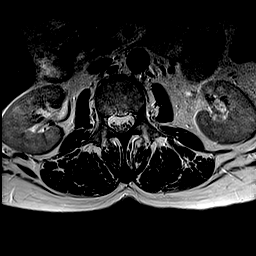
[im 29/34]
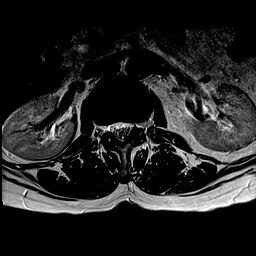
[im 34/34]
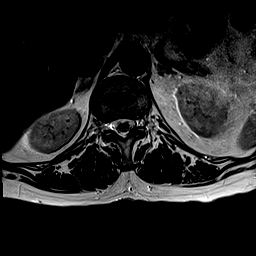

[40 of 48 positions shown; findings below may reference images not displayed]

FINDINGS: Segmentation: For the purposes of this dictation, five lumbar
vertebrae are assumed and the caudal most well-formed intervertebral
disc is designated L5-S1.

Alignment:  L3-L4 grade 1 anterolisthesis.

Vertebrae: Vertebral body height is maintained. Multilevel
degenerative endplate irregularity with small Schmorl nodes. Trace
degenerative endplate edema at L2-L3 and L4-L5.

Conus medullaris and cauda equina: Conus extends to the 1-L2 level.
No signal abnormality within the visualized distal spinal cord.

Paraspinal and other soft tissues: No abnormality identified within
included portions of the abdomen/retroperitoneum. Atro there is mild
atrophy of the lumbar paraspinal musculature.

Disc levels:

Multilevel disc degeneration. Most notably there is moderate disc
degeneration at L2-L3 and L3-L4 and moderate/advanced disc
degeneration at L5-S1.

T12-L1: No significant disc herniation or stenosis.

L1-L2: Subtle shallow left subarticular disc protrusion. No
significant spinal canal stenosis or neural foraminal narrowing.

L2-L3: Disc bulge with endplate spurring. Mild facet arthrosis. Mild
bilateral subarticular narrowing (greater on the left) with crowding
of the descending left L3 nerve root (series 7, image 13). Central
canal patent. Mild left inferior neural foraminal narrowing.

L3-L4: Grade 1 anterolisthesis with disc uncovering and disc bulge.
Advanced facet arthrosis with ligamentum flavum hypertrophy. Severe
spinal canal stenosis. There is redundancy of the cauda equina nerve
roots cephalad to this level. Mild/moderate bilateral neural
foraminal narrowing (greater on the left).

L4-L5: Disc bulge with endplate spurring. Mild facet
arthrosis/ligamentum flavum hypertrophy. Mild bilateral subarticular
narrowing (greater on the left) with slight crowding of the
descending left L5 nerve root (series 7, image 24). Mild relative
narrowing of the central canal. Bilateral neural foraminal narrowing
(moderate right, mild left).

L5-S1: Disc bulge asymmetric to the right. Associated endplate
spurring greatest along the right lateral aspect of the disc space.
Mild right subarticular narrowing without frank nerve root
impingement. Central canal patent. Mild bilateral neural foraminal
narrowing.
IMPRESSION: Lumbar spondylosis as outlined and most notably as follows.

At L3-L4, there is grade 1 anterolisthesis. Moderate disc
degeneration. Disc uncovering with disc bulge. Advanced facet
arthrosis with ligamentum flavum hypertrophy. Severe spinal canal
stenosis with likely cauda equina nerve root impingement. Redundancy
of the cauda equina nerve roots cephalad to this level.
Mild/moderate bilateral neural foraminal narrowing (greater on the
left).

Additional sites of mild subarticular and central canal narrowing as
described. Additional sites of neural foraminal narrowing as
detailed and greatest on the right at L4-L5 (moderate).

Also of note, there is moderate disc degeneration at L2-L3 and
moderate/advanced disc degeneration at L5-S1.

## 2021-06-24 DIAGNOSIS — F17210 Nicotine dependence, cigarettes, uncomplicated: Secondary | ICD-10-CM | POA: Diagnosis not present

## 2021-06-24 DIAGNOSIS — R053 Chronic cough: Secondary | ICD-10-CM | POA: Diagnosis not present

## 2021-07-08 DIAGNOSIS — R053 Chronic cough: Secondary | ICD-10-CM | POA: Diagnosis not present

## 2021-07-08 DIAGNOSIS — J42 Unspecified chronic bronchitis: Secondary | ICD-10-CM | POA: Diagnosis not present

## 2021-07-08 DIAGNOSIS — F17210 Nicotine dependence, cigarettes, uncomplicated: Secondary | ICD-10-CM | POA: Diagnosis not present

## 2021-07-15 DIAGNOSIS — F419 Anxiety disorder, unspecified: Secondary | ICD-10-CM | POA: Diagnosis not present

## 2021-07-15 DIAGNOSIS — J309 Allergic rhinitis, unspecified: Secondary | ICD-10-CM | POA: Diagnosis not present

## 2021-07-15 DIAGNOSIS — K219 Gastro-esophageal reflux disease without esophagitis: Secondary | ICD-10-CM | POA: Diagnosis not present

## 2021-07-15 DIAGNOSIS — N3281 Overactive bladder: Secondary | ICD-10-CM | POA: Diagnosis not present

## 2021-07-15 DIAGNOSIS — Z139 Encounter for screening, unspecified: Secondary | ICD-10-CM | POA: Diagnosis not present

## 2021-07-15 DIAGNOSIS — I10 Essential (primary) hypertension: Secondary | ICD-10-CM | POA: Diagnosis not present

## 2021-07-15 DIAGNOSIS — R519 Headache, unspecified: Secondary | ICD-10-CM | POA: Diagnosis not present

## 2021-07-15 DIAGNOSIS — M159 Polyosteoarthritis, unspecified: Secondary | ICD-10-CM | POA: Diagnosis not present

## 2021-07-15 DIAGNOSIS — R7989 Other specified abnormal findings of blood chemistry: Secondary | ICD-10-CM | POA: Diagnosis not present

## 2021-07-22 DIAGNOSIS — R053 Chronic cough: Secondary | ICD-10-CM | POA: Diagnosis not present

## 2021-07-22 DIAGNOSIS — J42 Unspecified chronic bronchitis: Secondary | ICD-10-CM | POA: Diagnosis not present

## 2021-07-22 DIAGNOSIS — F17210 Nicotine dependence, cigarettes, uncomplicated: Secondary | ICD-10-CM | POA: Diagnosis not present

## 2021-07-29 DIAGNOSIS — J309 Allergic rhinitis, unspecified: Secondary | ICD-10-CM | POA: Diagnosis not present

## 2021-07-29 DIAGNOSIS — N3281 Overactive bladder: Secondary | ICD-10-CM | POA: Diagnosis not present

## 2021-07-29 DIAGNOSIS — F419 Anxiety disorder, unspecified: Secondary | ICD-10-CM | POA: Diagnosis not present

## 2021-07-29 DIAGNOSIS — K219 Gastro-esophageal reflux disease without esophagitis: Secondary | ICD-10-CM | POA: Diagnosis not present

## 2021-07-29 DIAGNOSIS — J069 Acute upper respiratory infection, unspecified: Secondary | ICD-10-CM | POA: Diagnosis not present

## 2021-07-29 DIAGNOSIS — R7989 Other specified abnormal findings of blood chemistry: Secondary | ICD-10-CM | POA: Diagnosis not present

## 2021-07-29 DIAGNOSIS — R519 Headache, unspecified: Secondary | ICD-10-CM | POA: Diagnosis not present

## 2021-07-29 DIAGNOSIS — I10 Essential (primary) hypertension: Secondary | ICD-10-CM | POA: Diagnosis not present

## 2021-07-29 DIAGNOSIS — M159 Polyosteoarthritis, unspecified: Secondary | ICD-10-CM | POA: Diagnosis not present

## 2021-07-31 DIAGNOSIS — Z87891 Personal history of nicotine dependence: Secondary | ICD-10-CM | POA: Diagnosis not present

## 2021-07-31 DIAGNOSIS — F17210 Nicotine dependence, cigarettes, uncomplicated: Secondary | ICD-10-CM | POA: Diagnosis not present

## 2021-07-31 DIAGNOSIS — Z122 Encounter for screening for malignant neoplasm of respiratory organs: Secondary | ICD-10-CM | POA: Diagnosis not present

## 2021-08-14 DIAGNOSIS — R519 Headache, unspecified: Secondary | ICD-10-CM | POA: Diagnosis not present

## 2021-08-14 DIAGNOSIS — E781 Pure hyperglyceridemia: Secondary | ICD-10-CM | POA: Diagnosis not present

## 2021-08-14 DIAGNOSIS — M159 Polyosteoarthritis, unspecified: Secondary | ICD-10-CM | POA: Diagnosis not present

## 2021-08-14 DIAGNOSIS — J309 Allergic rhinitis, unspecified: Secondary | ICD-10-CM | POA: Diagnosis not present

## 2021-08-14 DIAGNOSIS — K219 Gastro-esophageal reflux disease without esophagitis: Secondary | ICD-10-CM | POA: Diagnosis not present

## 2021-08-14 DIAGNOSIS — F419 Anxiety disorder, unspecified: Secondary | ICD-10-CM | POA: Diagnosis not present

## 2021-08-14 DIAGNOSIS — R7989 Other specified abnormal findings of blood chemistry: Secondary | ICD-10-CM | POA: Diagnosis not present

## 2021-08-14 DIAGNOSIS — N3281 Overactive bladder: Secondary | ICD-10-CM | POA: Diagnosis not present

## 2021-08-14 DIAGNOSIS — I10 Essential (primary) hypertension: Secondary | ICD-10-CM | POA: Diagnosis not present

## 2021-08-18 DIAGNOSIS — L02415 Cutaneous abscess of right lower limb: Secondary | ICD-10-CM | POA: Diagnosis not present

## 2021-08-19 DIAGNOSIS — R498 Other voice and resonance disorders: Secondary | ICD-10-CM | POA: Diagnosis not present

## 2021-08-19 DIAGNOSIS — K219 Gastro-esophageal reflux disease without esophagitis: Secondary | ICD-10-CM | POA: Diagnosis not present

## 2021-08-19 DIAGNOSIS — F17210 Nicotine dependence, cigarettes, uncomplicated: Secondary | ICD-10-CM | POA: Diagnosis not present

## 2021-08-19 DIAGNOSIS — J42 Unspecified chronic bronchitis: Secondary | ICD-10-CM | POA: Diagnosis not present

## 2021-08-19 DIAGNOSIS — R053 Chronic cough: Secondary | ICD-10-CM | POA: Diagnosis not present

## 2021-09-02 DIAGNOSIS — J42 Unspecified chronic bronchitis: Secondary | ICD-10-CM | POA: Diagnosis not present

## 2021-09-02 DIAGNOSIS — R498 Other voice and resonance disorders: Secondary | ICD-10-CM | POA: Diagnosis not present

## 2021-09-02 DIAGNOSIS — R053 Chronic cough: Secondary | ICD-10-CM | POA: Diagnosis not present

## 2021-09-02 DIAGNOSIS — K219 Gastro-esophageal reflux disease without esophagitis: Secondary | ICD-10-CM | POA: Diagnosis not present

## 2021-09-02 DIAGNOSIS — F17210 Nicotine dependence, cigarettes, uncomplicated: Secondary | ICD-10-CM | POA: Diagnosis not present

## 2021-09-09 DIAGNOSIS — I10 Essential (primary) hypertension: Secondary | ICD-10-CM | POA: Diagnosis not present

## 2021-09-09 DIAGNOSIS — J309 Allergic rhinitis, unspecified: Secondary | ICD-10-CM | POA: Diagnosis not present

## 2021-09-09 DIAGNOSIS — R519 Headache, unspecified: Secondary | ICD-10-CM | POA: Diagnosis not present

## 2021-09-09 DIAGNOSIS — R059 Cough, unspecified: Secondary | ICD-10-CM | POA: Diagnosis not present

## 2021-09-09 DIAGNOSIS — Z87891 Personal history of nicotine dependence: Secondary | ICD-10-CM | POA: Diagnosis not present

## 2021-09-09 DIAGNOSIS — M159 Polyosteoarthritis, unspecified: Secondary | ICD-10-CM | POA: Diagnosis not present

## 2021-09-09 DIAGNOSIS — E781 Pure hyperglyceridemia: Secondary | ICD-10-CM | POA: Diagnosis not present

## 2021-09-09 DIAGNOSIS — R739 Hyperglycemia, unspecified: Secondary | ICD-10-CM | POA: Diagnosis not present

## 2021-09-09 DIAGNOSIS — N3281 Overactive bladder: Secondary | ICD-10-CM | POA: Diagnosis not present

## 2021-10-07 DIAGNOSIS — J309 Allergic rhinitis, unspecified: Secondary | ICD-10-CM | POA: Diagnosis not present

## 2021-10-07 DIAGNOSIS — N3281 Overactive bladder: Secondary | ICD-10-CM | POA: Diagnosis not present

## 2021-10-07 DIAGNOSIS — M65842 Other synovitis and tenosynovitis, left hand: Secondary | ICD-10-CM | POA: Diagnosis not present

## 2021-10-07 DIAGNOSIS — R519 Headache, unspecified: Secondary | ICD-10-CM | POA: Diagnosis not present

## 2021-10-07 DIAGNOSIS — F419 Anxiety disorder, unspecified: Secondary | ICD-10-CM | POA: Diagnosis not present

## 2021-10-07 DIAGNOSIS — I10 Essential (primary) hypertension: Secondary | ICD-10-CM | POA: Diagnosis not present

## 2021-10-07 DIAGNOSIS — R7989 Other specified abnormal findings of blood chemistry: Secondary | ICD-10-CM | POA: Diagnosis not present

## 2021-10-07 DIAGNOSIS — K219 Gastro-esophageal reflux disease without esophagitis: Secondary | ICD-10-CM | POA: Diagnosis not present

## 2021-10-07 DIAGNOSIS — E781 Pure hyperglyceridemia: Secondary | ICD-10-CM | POA: Diagnosis not present

## 2021-10-14 DIAGNOSIS — N3281 Overactive bladder: Secondary | ICD-10-CM | POA: Diagnosis not present

## 2021-10-14 DIAGNOSIS — R7989 Other specified abnormal findings of blood chemistry: Secondary | ICD-10-CM | POA: Diagnosis not present

## 2021-10-14 DIAGNOSIS — I10 Essential (primary) hypertension: Secondary | ICD-10-CM | POA: Diagnosis not present

## 2021-10-14 DIAGNOSIS — M79604 Pain in right leg: Secondary | ICD-10-CM | POA: Diagnosis not present

## 2021-10-14 DIAGNOSIS — R519 Headache, unspecified: Secondary | ICD-10-CM | POA: Diagnosis not present

## 2021-10-14 DIAGNOSIS — J309 Allergic rhinitis, unspecified: Secondary | ICD-10-CM | POA: Diagnosis not present

## 2021-10-14 DIAGNOSIS — F419 Anxiety disorder, unspecified: Secondary | ICD-10-CM | POA: Diagnosis not present

## 2021-10-14 DIAGNOSIS — M659 Synovitis and tenosynovitis, unspecified: Secondary | ICD-10-CM | POA: Diagnosis not present

## 2021-10-14 DIAGNOSIS — K219 Gastro-esophageal reflux disease without esophagitis: Secondary | ICD-10-CM | POA: Diagnosis not present

## 2021-11-04 DIAGNOSIS — J309 Allergic rhinitis, unspecified: Secondary | ICD-10-CM | POA: Diagnosis not present

## 2021-11-04 DIAGNOSIS — R5382 Chronic fatigue, unspecified: Secondary | ICD-10-CM | POA: Diagnosis not present

## 2021-11-04 DIAGNOSIS — M159 Polyosteoarthritis, unspecified: Secondary | ICD-10-CM | POA: Diagnosis not present

## 2021-11-04 DIAGNOSIS — F419 Anxiety disorder, unspecified: Secondary | ICD-10-CM | POA: Diagnosis not present

## 2021-11-04 DIAGNOSIS — R519 Headache, unspecified: Secondary | ICD-10-CM | POA: Diagnosis not present

## 2021-11-04 DIAGNOSIS — E785 Hyperlipidemia, unspecified: Secondary | ICD-10-CM | POA: Diagnosis not present

## 2021-11-04 DIAGNOSIS — K219 Gastro-esophageal reflux disease without esophagitis: Secondary | ICD-10-CM | POA: Diagnosis not present

## 2021-11-04 DIAGNOSIS — N3281 Overactive bladder: Secondary | ICD-10-CM | POA: Diagnosis not present

## 2021-11-04 DIAGNOSIS — I10 Essential (primary) hypertension: Secondary | ICD-10-CM | POA: Diagnosis not present

## 2021-12-02 DIAGNOSIS — R519 Headache, unspecified: Secondary | ICD-10-CM | POA: Diagnosis not present

## 2021-12-02 DIAGNOSIS — L03114 Cellulitis of left upper limb: Secondary | ICD-10-CM | POA: Diagnosis not present

## 2021-12-02 DIAGNOSIS — M159 Polyosteoarthritis, unspecified: Secondary | ICD-10-CM | POA: Diagnosis not present

## 2021-12-02 DIAGNOSIS — F419 Anxiety disorder, unspecified: Secondary | ICD-10-CM | POA: Diagnosis not present

## 2021-12-02 DIAGNOSIS — I10 Essential (primary) hypertension: Secondary | ICD-10-CM | POA: Diagnosis not present

## 2021-12-02 DIAGNOSIS — N3281 Overactive bladder: Secondary | ICD-10-CM | POA: Diagnosis not present

## 2021-12-02 DIAGNOSIS — E785 Hyperlipidemia, unspecified: Secondary | ICD-10-CM | POA: Diagnosis not present

## 2021-12-02 DIAGNOSIS — K219 Gastro-esophageal reflux disease without esophagitis: Secondary | ICD-10-CM | POA: Diagnosis not present

## 2021-12-02 DIAGNOSIS — J309 Allergic rhinitis, unspecified: Secondary | ICD-10-CM | POA: Diagnosis not present

## 2021-12-09 DIAGNOSIS — R053 Chronic cough: Secondary | ICD-10-CM | POA: Diagnosis not present

## 2021-12-09 DIAGNOSIS — J42 Unspecified chronic bronchitis: Secondary | ICD-10-CM | POA: Diagnosis not present

## 2021-12-09 DIAGNOSIS — K219 Gastro-esophageal reflux disease without esophagitis: Secondary | ICD-10-CM | POA: Diagnosis not present

## 2021-12-09 DIAGNOSIS — R498 Other voice and resonance disorders: Secondary | ICD-10-CM | POA: Diagnosis not present

## 2021-12-09 DIAGNOSIS — F17210 Nicotine dependence, cigarettes, uncomplicated: Secondary | ICD-10-CM | POA: Diagnosis not present

## 2021-12-23 DIAGNOSIS — E785 Hyperlipidemia, unspecified: Secondary | ICD-10-CM | POA: Diagnosis not present

## 2021-12-23 DIAGNOSIS — K219 Gastro-esophageal reflux disease without esophagitis: Secondary | ICD-10-CM | POA: Diagnosis not present

## 2021-12-23 DIAGNOSIS — J309 Allergic rhinitis, unspecified: Secondary | ICD-10-CM | POA: Diagnosis not present

## 2021-12-23 DIAGNOSIS — I10 Essential (primary) hypertension: Secondary | ICD-10-CM | POA: Diagnosis not present

## 2021-12-23 DIAGNOSIS — F419 Anxiety disorder, unspecified: Secondary | ICD-10-CM | POA: Diagnosis not present

## 2021-12-23 DIAGNOSIS — Z23 Encounter for immunization: Secondary | ICD-10-CM | POA: Diagnosis not present

## 2021-12-23 DIAGNOSIS — R739 Hyperglycemia, unspecified: Secondary | ICD-10-CM | POA: Diagnosis not present

## 2021-12-23 DIAGNOSIS — R2232 Localized swelling, mass and lump, left upper limb: Secondary | ICD-10-CM | POA: Diagnosis not present

## 2021-12-23 DIAGNOSIS — M159 Polyosteoarthritis, unspecified: Secondary | ICD-10-CM | POA: Diagnosis not present

## 2021-12-23 DIAGNOSIS — N3281 Overactive bladder: Secondary | ICD-10-CM | POA: Diagnosis not present

## 2021-12-30 DIAGNOSIS — R2232 Localized swelling, mass and lump, left upper limb: Secondary | ICD-10-CM | POA: Diagnosis not present

## 2021-12-30 DIAGNOSIS — N3281 Overactive bladder: Secondary | ICD-10-CM | POA: Diagnosis not present

## 2021-12-30 DIAGNOSIS — R519 Headache, unspecified: Secondary | ICD-10-CM | POA: Diagnosis not present

## 2021-12-30 DIAGNOSIS — E785 Hyperlipidemia, unspecified: Secondary | ICD-10-CM | POA: Diagnosis not present

## 2021-12-30 DIAGNOSIS — M159 Polyosteoarthritis, unspecified: Secondary | ICD-10-CM | POA: Diagnosis not present

## 2021-12-30 DIAGNOSIS — J309 Allergic rhinitis, unspecified: Secondary | ICD-10-CM | POA: Diagnosis not present

## 2021-12-30 DIAGNOSIS — K219 Gastro-esophageal reflux disease without esophagitis: Secondary | ICD-10-CM | POA: Diagnosis not present

## 2021-12-30 DIAGNOSIS — F419 Anxiety disorder, unspecified: Secondary | ICD-10-CM | POA: Diagnosis not present

## 2021-12-30 DIAGNOSIS — I10 Essential (primary) hypertension: Secondary | ICD-10-CM | POA: Diagnosis not present

## 2022-01-20 DIAGNOSIS — D485 Neoplasm of uncertain behavior of skin: Secondary | ICD-10-CM | POA: Diagnosis not present

## 2022-01-20 DIAGNOSIS — R233 Spontaneous ecchymoses: Secondary | ICD-10-CM | POA: Diagnosis not present

## 2022-01-20 DIAGNOSIS — L570 Actinic keratosis: Secondary | ICD-10-CM | POA: Diagnosis not present

## 2022-02-03 DIAGNOSIS — R519 Headache, unspecified: Secondary | ICD-10-CM | POA: Diagnosis not present

## 2022-02-03 DIAGNOSIS — J309 Allergic rhinitis, unspecified: Secondary | ICD-10-CM | POA: Diagnosis not present

## 2022-02-03 DIAGNOSIS — R2232 Localized swelling, mass and lump, left upper limb: Secondary | ICD-10-CM | POA: Diagnosis not present

## 2022-02-03 DIAGNOSIS — F419 Anxiety disorder, unspecified: Secondary | ICD-10-CM | POA: Diagnosis not present

## 2022-02-03 DIAGNOSIS — N3281 Overactive bladder: Secondary | ICD-10-CM | POA: Diagnosis not present

## 2022-02-03 DIAGNOSIS — I10 Essential (primary) hypertension: Secondary | ICD-10-CM | POA: Diagnosis not present

## 2022-02-03 DIAGNOSIS — M159 Polyosteoarthritis, unspecified: Secondary | ICD-10-CM | POA: Diagnosis not present

## 2022-02-03 DIAGNOSIS — K219 Gastro-esophageal reflux disease without esophagitis: Secondary | ICD-10-CM | POA: Diagnosis not present

## 2022-02-03 DIAGNOSIS — E785 Hyperlipidemia, unspecified: Secondary | ICD-10-CM | POA: Diagnosis not present

## 2022-03-03 DIAGNOSIS — J309 Allergic rhinitis, unspecified: Secondary | ICD-10-CM | POA: Diagnosis not present

## 2022-03-03 DIAGNOSIS — I10 Essential (primary) hypertension: Secondary | ICD-10-CM | POA: Diagnosis not present

## 2022-03-03 DIAGNOSIS — R2232 Localized swelling, mass and lump, left upper limb: Secondary | ICD-10-CM | POA: Diagnosis not present

## 2022-03-03 DIAGNOSIS — K219 Gastro-esophageal reflux disease without esophagitis: Secondary | ICD-10-CM | POA: Diagnosis not present

## 2022-03-03 DIAGNOSIS — M159 Polyosteoarthritis, unspecified: Secondary | ICD-10-CM | POA: Diagnosis not present

## 2022-03-03 DIAGNOSIS — E785 Hyperlipidemia, unspecified: Secondary | ICD-10-CM | POA: Diagnosis not present

## 2022-03-03 DIAGNOSIS — F419 Anxiety disorder, unspecified: Secondary | ICD-10-CM | POA: Diagnosis not present

## 2022-03-03 DIAGNOSIS — M25551 Pain in right hip: Secondary | ICD-10-CM | POA: Diagnosis not present

## 2022-03-03 DIAGNOSIS — N3281 Overactive bladder: Secondary | ICD-10-CM | POA: Diagnosis not present

## 2022-03-11 DIAGNOSIS — M25552 Pain in left hip: Secondary | ICD-10-CM | POA: Diagnosis not present

## 2022-03-11 DIAGNOSIS — I10 Essential (primary) hypertension: Secondary | ICD-10-CM | POA: Diagnosis not present

## 2022-03-11 DIAGNOSIS — E785 Hyperlipidemia, unspecified: Secondary | ICD-10-CM | POA: Diagnosis not present

## 2022-03-11 DIAGNOSIS — F419 Anxiety disorder, unspecified: Secondary | ICD-10-CM | POA: Diagnosis not present

## 2022-03-11 DIAGNOSIS — M159 Polyosteoarthritis, unspecified: Secondary | ICD-10-CM | POA: Diagnosis not present

## 2022-03-11 DIAGNOSIS — R2232 Localized swelling, mass and lump, left upper limb: Secondary | ICD-10-CM | POA: Diagnosis not present

## 2022-03-11 DIAGNOSIS — N3281 Overactive bladder: Secondary | ICD-10-CM | POA: Diagnosis not present

## 2022-03-11 DIAGNOSIS — K219 Gastro-esophageal reflux disease without esophagitis: Secondary | ICD-10-CM | POA: Diagnosis not present

## 2022-03-11 DIAGNOSIS — J309 Allergic rhinitis, unspecified: Secondary | ICD-10-CM | POA: Diagnosis not present

## 2022-03-24 DIAGNOSIS — Z720 Tobacco use: Secondary | ICD-10-CM | POA: Diagnosis not present

## 2022-03-24 DIAGNOSIS — E785 Hyperlipidemia, unspecified: Secondary | ICD-10-CM | POA: Diagnosis not present

## 2022-03-24 DIAGNOSIS — M159 Polyosteoarthritis, unspecified: Secondary | ICD-10-CM | POA: Diagnosis not present

## 2022-03-24 DIAGNOSIS — K219 Gastro-esophageal reflux disease without esophagitis: Secondary | ICD-10-CM | POA: Diagnosis not present

## 2022-03-24 DIAGNOSIS — J309 Allergic rhinitis, unspecified: Secondary | ICD-10-CM | POA: Diagnosis not present

## 2022-03-24 DIAGNOSIS — R519 Headache, unspecified: Secondary | ICD-10-CM | POA: Diagnosis not present

## 2022-03-24 DIAGNOSIS — N3281 Overactive bladder: Secondary | ICD-10-CM | POA: Diagnosis not present

## 2022-03-24 DIAGNOSIS — F419 Anxiety disorder, unspecified: Secondary | ICD-10-CM | POA: Diagnosis not present

## 2022-03-24 DIAGNOSIS — I10 Essential (primary) hypertension: Secondary | ICD-10-CM | POA: Diagnosis not present

## 2022-03-31 DIAGNOSIS — J309 Allergic rhinitis, unspecified: Secondary | ICD-10-CM | POA: Diagnosis not present

## 2022-03-31 DIAGNOSIS — N3281 Overactive bladder: Secondary | ICD-10-CM | POA: Diagnosis not present

## 2022-03-31 DIAGNOSIS — H40003 Preglaucoma, unspecified, bilateral: Secondary | ICD-10-CM | POA: Diagnosis not present

## 2022-03-31 DIAGNOSIS — I10 Essential (primary) hypertension: Secondary | ICD-10-CM | POA: Diagnosis not present

## 2022-03-31 DIAGNOSIS — H524 Presbyopia: Secondary | ICD-10-CM | POA: Diagnosis not present

## 2022-03-31 DIAGNOSIS — M159 Polyosteoarthritis, unspecified: Secondary | ICD-10-CM | POA: Diagnosis not present

## 2022-03-31 DIAGNOSIS — F419 Anxiety disorder, unspecified: Secondary | ICD-10-CM | POA: Diagnosis not present

## 2022-03-31 DIAGNOSIS — E785 Hyperlipidemia, unspecified: Secondary | ICD-10-CM | POA: Diagnosis not present

## 2022-03-31 DIAGNOSIS — Z720 Tobacco use: Secondary | ICD-10-CM | POA: Diagnosis not present

## 2022-03-31 DIAGNOSIS — R519 Headache, unspecified: Secondary | ICD-10-CM | POA: Diagnosis not present

## 2022-03-31 DIAGNOSIS — K219 Gastro-esophageal reflux disease without esophagitis: Secondary | ICD-10-CM | POA: Diagnosis not present

## 2022-04-10 ENCOUNTER — Other Ambulatory Visit: Payer: Self-pay

## 2022-04-10 DIAGNOSIS — E785 Hyperlipidemia, unspecified: Secondary | ICD-10-CM | POA: Insufficient documentation

## 2022-04-10 DIAGNOSIS — I10 Essential (primary) hypertension: Secondary | ICD-10-CM | POA: Insufficient documentation

## 2022-04-14 NOTE — Progress Notes (Signed)
Cardiology Office Note:    Date:  04/15/2022   ID:  Blessed Carringer, DOB 1955-12-18, MRN [id number]  PCP:  Simone Curia, MD  Cardiologist:  Norman Herrlich, MD   Referring MD: Marcellus Scott, MD  ASSESSMENT:    1. Coronary artery calcification seen on CT scan   2. Primary hypertension   3. Hyperlipidemia, unspecified hyperlipidemia type   4. Chronic obstructive pulmonary disease, unspecified COPD type (HCC)     PLAN:    In order of problems listed above:  By itself coronary artery calcification is not uncommon and not always indicative of obstructive CAD her EKG is normal she has no cardiovascular symptoms at this time I do not think she requires an ischemia evaluation.  The important issues to stop reassess the person's risk and to initiate lipid-lowering therapy.  I also think she would benefit from low-dose aspirin with no contraindication.  If she were to develop cardiovascular symptoms angina or anginal equivalent and cardiac CTA would be appropriate.  She will remain on her high intensity statin BP at target continue current treatment calcium channel blocker ACE inhibitor Continue her high intensity statin her LDL is at target Stable COPD remains active continue current maintenance bronchodilator  Next appointment as needed   Medication Adjustments/Labs and Tests Ordered: Current medicines are reviewed at length with the patient today.  Concerns regarding medicines are outlined above.  No orders of the defined types were placed in this encounter.  No orders of the defined types were placed in this encounter.   Chief complaint I had calcification of the coronary arteries on chest CT  History of Present Illness:    Diana Acevedo is a 67 y.o. female with a history of hypertension hyperlipidemia and current cigarette smoking who is being seen today for the evaluation of coronary artery calcification on CT scan at the request of Marcellus Scott, MD. she is sees pulmonary for her  chronic cough and chronic bronchitis and the CT scan also showed findings of emphysema  In May she had a chest CT that showed coronary artery calcification.  She has no known history of heart disease congenital rheumatic or heart murmur. Decades ago in her 30s she had an episode of chest pain was admitted to the hospital extensive testing and was told it was not cardiac related. 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 [employer] very busy active travels and has no exercise intolerance edema shortness of breath chest pain palpitation or syncope She is been taking a statin for several years for lipid profile 08/14/2021 showed an LDL of 95 cholesterol 179 W0J 5.5% Past Medical History:  Diagnosis Date   Degeneration of lumbar intervertebral disc 08/09/2019   Hypercholesterolemia 08/09/2019   Hyperlipidemia    Hypertension     Past Surgical History:  Procedure Laterality Date   CHOLECYSTECTOMY     HYSTEROTOMY      Current Medications: Current Meds  Medication Sig   amLODipine (NORVASC) 10 MG tablet Take 10 mg by mouth daily.   ammonium lactate (LAC-HYDRIN) 12 % lotion Apply 1 Application topically 2 (two) times daily.   atorvastatin (LIPITOR) 40 MG tablet Take 1 tablet by mouth daily.   budesonide-formoterol (SYMBICORT) 160-4.5 MCG/ACT inhaler Inhale 2 puffs into the lungs 2 (two) times daily.   BUPROPION HCL PO Take 150 mg by mouth daily.   cetirizine (ZYRTEC) 10 MG chewable tablet Chew 10 mg by mouth daily.   fluticasone (FLONASE) 50 MCG/ACT nasal spray Place 1 spray into  both nostrils 2 (two) times daily.   HYDROcodone-acetaminophen (NORCO) 7.5-325 MG tablet Take 1 tablet by mouth every 8 (eight) hours as needed for severe pain or moderate pain.   lisinopril (ZESTRIL) 20 MG tablet Take 20 mg by mouth daily.   montelukast (SINGULAIR) 10 MG tablet Take 10 mg by mouth at bedtime.   omeprazole (PRILOSEC) 40 MG capsule Take 40 mg by mouth daily.   solifenacin (VESICARE) 10 MG tablet Take 10  mg by mouth daily.   traMADol (ULTRAM) 50 MG tablet Take 50 mg by mouth 3 (three) times daily.     Allergies:   Patient has no known allergies.   Social History   Socioeconomic History   Marital status: Widowed    Spouse name: Not on file   Number of children: Not on file   Years of education: Not on file   Highest education level: Not on file  Occupational History   Not on file  Tobacco Use   Smoking status: Every Day    Packs/day: 0.50    Years: 30.00    Total pack years: 15.00    Types: Cigarettes    Passive exposure: Current   Smokeless tobacco: Never  Substance and Sexual Activity   Alcohol use: Not Currently   Drug use: Never   Sexual activity: Not on file  Other Topics Concern   Not on file  Social History Narrative   Not on file   Social Determinants of Health   Financial Resource Strain: Not on file  Food Insecurity: Not on file  Transportation Needs: Not on file  Physical Activity: Not on file  Stress: Not on file  Social Connections: Not on file     Family History: The patient's family history includes Stroke in her mother.  ROS:   ROS Please see the history of present illness.     All other systems reviewed and are negative.  EKGs/Labs/Other Studies Reviewed:    The following studies were reviewed today:   EKG:  EKG is  ordered today.  The ekg ordered today is personally reviewed and demonstrates sinus rhythm and is normal    Physical Exam:    VS:  BP 138/65   Pulse 71   Ht 5\' 1"  (1.549 m)   Wt 139 lb 12.8 oz (63.4 kg)   SpO2 95%   BMI 26.41 kg/m     Wt Readings from Last 3 Encounters:  04/15/22 139 lb 12.8 oz (63.4 kg)     GEN:  Well nourished, well developed in no acute distress HEENT: Normal NECK: No JVD; No carotid bruits LYMPHATICS: No lymphadenopathy CARDIAC: RRR, no murmurs, rubs, gallops RESPIRATORY:  Clear to auscultation without rales, wheezing or rhonchi  ABDOMEN: Soft, non-tender, non-distended MUSCULOSKELETAL:   No edema; No deformity  SKIN: Warm and dry NEUROLOGIC:  Alert and oriented x 3 PSYCHIATRIC:  Normal affect     Signed, Norman Herrlich, MD  04/15/2022 3:50 PM    [facility]

## 2022-04-15 ENCOUNTER — Encounter: Payer: Self-pay | Admitting: Cardiology

## 2022-04-15 ENCOUNTER — Ambulatory Visit: Payer: Medicare HMO | Attending: Cardiology | Admitting: Cardiology

## 2022-04-15 VITALS — BP 138/65 | HR 71 | Ht 61.0 in | Wt 139.8 lb

## 2022-04-15 DIAGNOSIS — M5136 Other intervertebral disc degeneration, lumbar region: Secondary | ICD-10-CM

## 2022-04-15 DIAGNOSIS — E785 Hyperlipidemia, unspecified: Secondary | ICD-10-CM

## 2022-04-15 DIAGNOSIS — I10 Essential (primary) hypertension: Secondary | ICD-10-CM

## 2022-04-15 DIAGNOSIS — J449 Chronic obstructive pulmonary disease, unspecified: Secondary | ICD-10-CM

## 2022-04-15 DIAGNOSIS — I2510 Atherosclerotic heart disease of native coronary artery without angina pectoris: Secondary | ICD-10-CM

## 2022-04-15 DIAGNOSIS — E7800 Pure hypercholesterolemia, unspecified: Secondary | ICD-10-CM

## 2022-04-15 MED ORDER — ASPIRIN 81 MG PO TBEC: 81.0000 mg | DELAYED_RELEASE_TABLET | Freq: Every day | ORAL | 3 refills | Status: AC

## 2022-04-15 NOTE — Patient Instructions (Signed)
Medication Instructions:  Your physician has recommended you make the following change in your medication:   START: Aspirin 81 mg daily  *If you need a refill on your cardiac medications before your next appointment, please call your pharmacy*   Lab Work: None If you have labs (blood work) drawn today and your tests are completely normal, you will receive your results only by: [place] (if you have MyChart) OR A paper copy in the mail If you have any lab test that is abnormal or we need to change your treatment, we will call you to review the results.   Testing/Procedures: None   Follow-Up: At [facility], you and your health needs are our priority.  As part of our continuing mission to provide you with exceptional heart care, we have created designated Provider Care Teams.  These Care Teams include your primary Cardiologist (physician) and Advanced Practice Providers (APPs -  Physician Assistants and Nurse Practitioners) who all work together to provide you with the care you need, when you need it.  We recommend signing up for the patient portal called "MyChart".  Sign up information is provided on this After Visit Summary.  MyChart is used to connect with patients for Virtual Visits (Telemedicine).  Patients are able to view lab/test results, encounter notes, upcoming appointments, etc.  Non-urgent messages can be sent to your provider as well.   To learn more about what you can do with MyChart, go to [email or web address].    Your next appointment:   Follow up as needed  Provider:   Shirlee More, MD    Other Instructions None

## 2022-04-28 DIAGNOSIS — Z0000 Encounter for general adult medical examination without abnormal findings: Secondary | ICD-10-CM | POA: Diagnosis not present

## 2022-04-28 DIAGNOSIS — R252 Cramp and spasm: Secondary | ICD-10-CM | POA: Diagnosis not present

## 2022-04-28 DIAGNOSIS — M159 Polyosteoarthritis, unspecified: Secondary | ICD-10-CM | POA: Diagnosis not present

## 2022-04-28 DIAGNOSIS — E669 Obesity, unspecified: Secondary | ICD-10-CM | POA: Diagnosis not present

## 2022-04-28 DIAGNOSIS — I10 Essential (primary) hypertension: Secondary | ICD-10-CM | POA: Diagnosis not present

## 2022-04-28 DIAGNOSIS — Z9181 History of falling: Secondary | ICD-10-CM | POA: Diagnosis not present

## 2022-04-28 DIAGNOSIS — E785 Hyperlipidemia, unspecified: Secondary | ICD-10-CM | POA: Diagnosis not present

## 2022-04-29 DIAGNOSIS — F17210 Nicotine dependence, cigarettes, uncomplicated: Secondary | ICD-10-CM | POA: Diagnosis not present

## 2022-04-29 DIAGNOSIS — K219 Gastro-esophageal reflux disease without esophagitis: Secondary | ICD-10-CM | POA: Diagnosis not present

## 2022-04-29 DIAGNOSIS — J42 Unspecified chronic bronchitis: Secondary | ICD-10-CM | POA: Diagnosis not present

## 2022-05-12 DIAGNOSIS — K219 Gastro-esophageal reflux disease without esophagitis: Secondary | ICD-10-CM | POA: Diagnosis not present

## 2022-05-12 DIAGNOSIS — F419 Anxiety disorder, unspecified: Secondary | ICD-10-CM | POA: Diagnosis not present

## 2022-05-12 DIAGNOSIS — J309 Allergic rhinitis, unspecified: Secondary | ICD-10-CM | POA: Diagnosis not present

## 2022-05-12 DIAGNOSIS — E785 Hyperlipidemia, unspecified: Secondary | ICD-10-CM | POA: Diagnosis not present

## 2022-05-12 DIAGNOSIS — M25551 Pain in right hip: Secondary | ICD-10-CM | POA: Diagnosis not present

## 2022-05-12 DIAGNOSIS — I10 Essential (primary) hypertension: Secondary | ICD-10-CM | POA: Diagnosis not present

## 2022-05-12 DIAGNOSIS — M159 Polyosteoarthritis, unspecified: Secondary | ICD-10-CM | POA: Diagnosis not present

## 2022-05-12 DIAGNOSIS — R252 Cramp and spasm: Secondary | ICD-10-CM | POA: Diagnosis not present

## 2022-05-12 DIAGNOSIS — N3281 Overactive bladder: Secondary | ICD-10-CM | POA: Diagnosis not present

## 2022-05-26 DIAGNOSIS — J309 Allergic rhinitis, unspecified: Secondary | ICD-10-CM | POA: Diagnosis not present

## 2022-05-26 DIAGNOSIS — N3281 Overactive bladder: Secondary | ICD-10-CM | POA: Diagnosis not present

## 2022-05-26 DIAGNOSIS — K219 Gastro-esophageal reflux disease without esophagitis: Secondary | ICD-10-CM | POA: Diagnosis not present

## 2022-05-26 DIAGNOSIS — F419 Anxiety disorder, unspecified: Secondary | ICD-10-CM | POA: Diagnosis not present

## 2022-05-26 DIAGNOSIS — E785 Hyperlipidemia, unspecified: Secondary | ICD-10-CM | POA: Diagnosis not present

## 2022-05-26 DIAGNOSIS — R519 Headache, unspecified: Secondary | ICD-10-CM | POA: Diagnosis not present

## 2022-05-26 DIAGNOSIS — R252 Cramp and spasm: Secondary | ICD-10-CM | POA: Diagnosis not present

## 2022-05-26 DIAGNOSIS — I10 Essential (primary) hypertension: Secondary | ICD-10-CM | POA: Diagnosis not present

## 2022-05-26 DIAGNOSIS — M159 Polyosteoarthritis, unspecified: Secondary | ICD-10-CM | POA: Diagnosis not present

## 2022-05-31 DIAGNOSIS — J309 Allergic rhinitis, unspecified: Secondary | ICD-10-CM | POA: Diagnosis not present

## 2022-05-31 DIAGNOSIS — M159 Polyosteoarthritis, unspecified: Secondary | ICD-10-CM | POA: Diagnosis not present

## 2022-05-31 DIAGNOSIS — F419 Anxiety disorder, unspecified: Secondary | ICD-10-CM | POA: Diagnosis not present

## 2022-05-31 DIAGNOSIS — R252 Cramp and spasm: Secondary | ICD-10-CM | POA: Diagnosis not present

## 2022-05-31 DIAGNOSIS — K219 Gastro-esophageal reflux disease without esophagitis: Secondary | ICD-10-CM | POA: Diagnosis not present

## 2022-05-31 DIAGNOSIS — N3281 Overactive bladder: Secondary | ICD-10-CM | POA: Diagnosis not present

## 2022-05-31 DIAGNOSIS — J069 Acute upper respiratory infection, unspecified: Secondary | ICD-10-CM | POA: Diagnosis not present

## 2022-05-31 DIAGNOSIS — E785 Hyperlipidemia, unspecified: Secondary | ICD-10-CM | POA: Diagnosis not present

## 2022-05-31 DIAGNOSIS — I10 Essential (primary) hypertension: Secondary | ICD-10-CM | POA: Diagnosis not present
